# Patient Record
Sex: Female | Born: 1965 | Race: Black or African American | Hispanic: No | Marital: Married | Smoking: Never smoker
Health system: Southern US, Community
[De-identification: ages and names within clinical notes are randomized; demographics above are authoritative.]

## PROBLEM LIST (undated history)

## (undated) DIAGNOSIS — M549 Dorsalgia, unspecified: Secondary | ICD-10-CM

## (undated) DIAGNOSIS — M5136 Other intervertebral disc degeneration, lumbar region: Secondary | ICD-10-CM

## (undated) DIAGNOSIS — K5900 Constipation, unspecified: Secondary | ICD-10-CM

## (undated) DIAGNOSIS — K649 Unspecified hemorrhoids: Secondary | ICD-10-CM

## (undated) DIAGNOSIS — M51369 Other intervertebral disc degeneration, lumbar region without mention of lumbar back pain or lower extremity pain: Secondary | ICD-10-CM

## (undated) HISTORY — DX: Other intervertebral disc degeneration, lumbar region without mention of lumbar back pain or lower extremity pain: M51.369

## (undated) HISTORY — DX: Constipation, unspecified: K59.00

## (undated) HISTORY — DX: Unspecified hemorrhoids: K64.9

## (undated) HISTORY — DX: Other intervertebral disc degeneration, lumbar region: M51.36

## (undated) HISTORY — PX: ABDOMINAL HYSTERECTOMY: SHX81

## (undated) HISTORY — PX: BREAST BIOPSY: SHX20

---

## 2004-02-13 ENCOUNTER — Inpatient Hospital Stay (HOSPITAL_COMMUNITY): Admission: EM | Admit: 2004-02-13 | Discharge: 2004-02-18 | Payer: Self-pay | Admitting: Emergency Medicine

## 2004-02-15 ENCOUNTER — Encounter: Payer: Self-pay | Admitting: Internal Medicine

## 2005-03-27 IMAGING — CT CT ABDOMEN W/ CM
1 series · 16 of 32 positions shown, 20 images · IV contrast (GASTROGRAFIN)
Comparison: 02/13/2004.

CLINICAL DATA: Right lower quadrant pain.  
 CT ABDOMEN AND PELVIS WITH CONTRAST ? 02/15/2004 (2922 hours)

[Series 2: routine abdomen · axial · 0.70mm/px · z∈[-441,-46]mm · 16 of 114 slices shown, 20 images]
[im 8/114  soft-tissue]
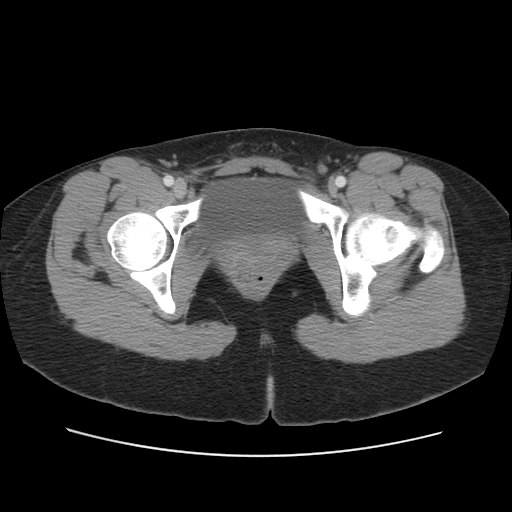
[im 8/114  bone]
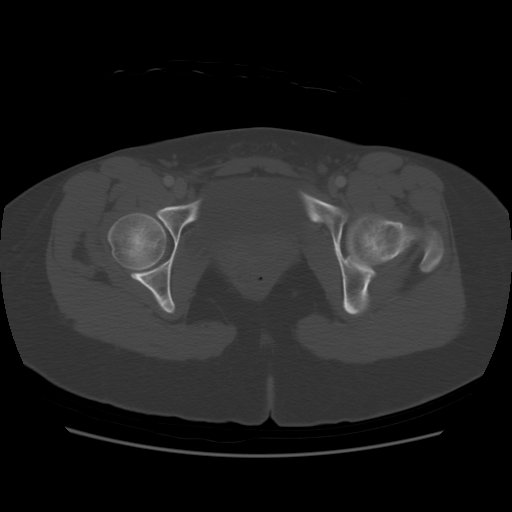
[im 15/114  soft-tissue]
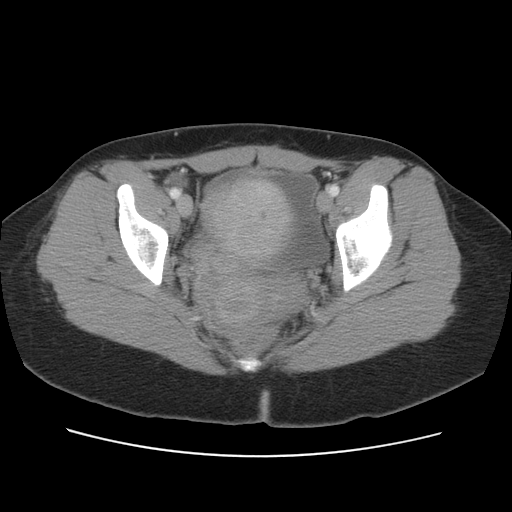
[im 22/114  soft-tissue]
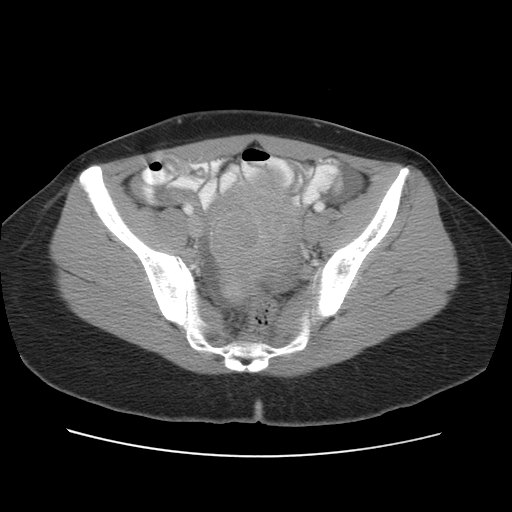
[im 30/114  soft-tissue]
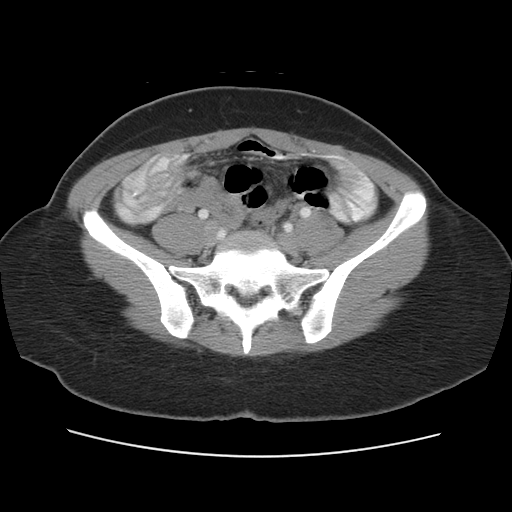
[im 37/114  soft-tissue]
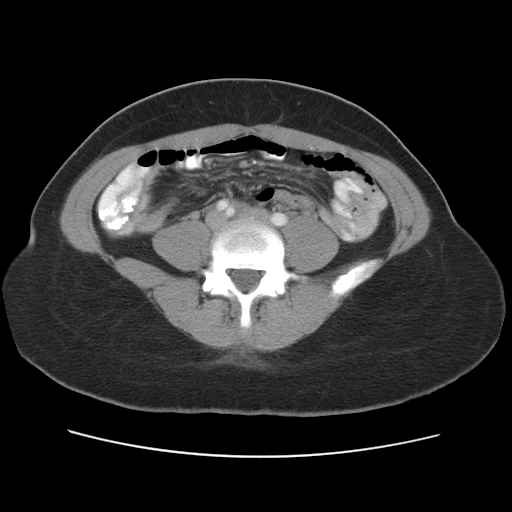
[im 44/114  soft-tissue]
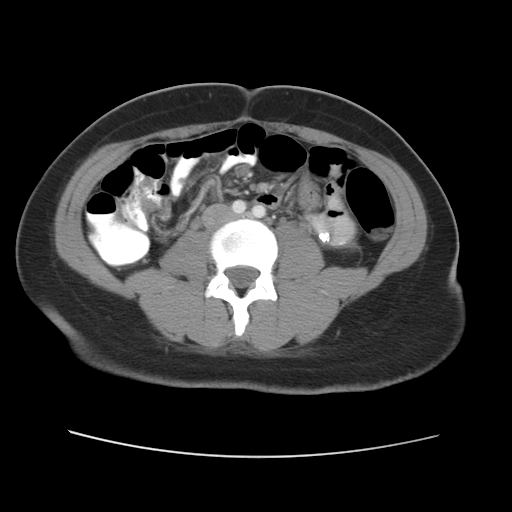
[im 52/114  soft-tissue]
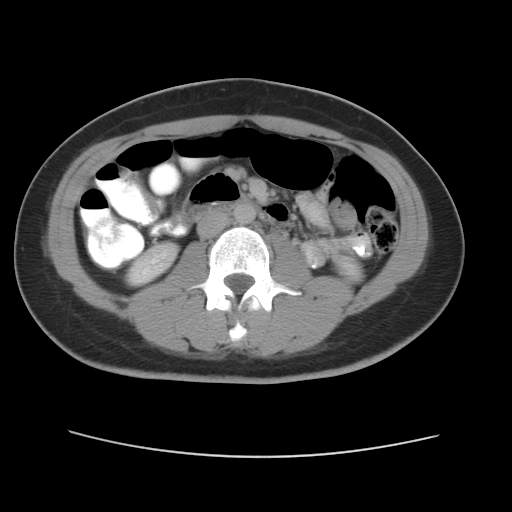
[im 62/114  soft-tissue]
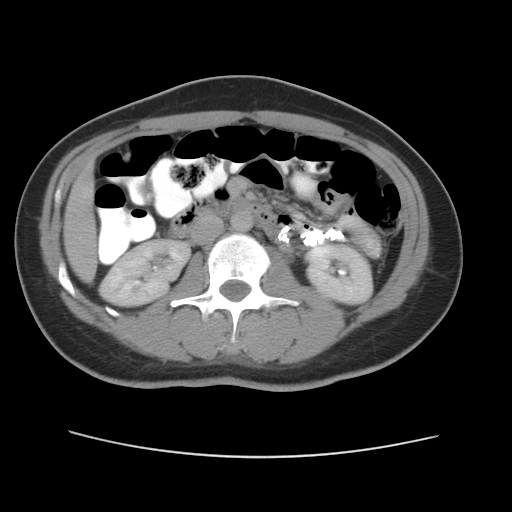
[im 70/114  soft-tissue]
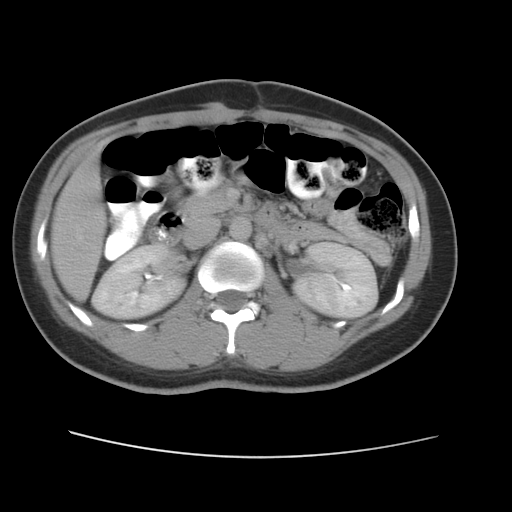
[im 70/114  bone]
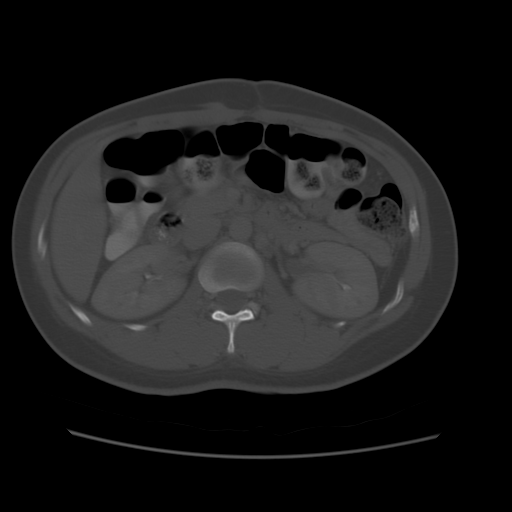
[im 77/114  soft-tissue]
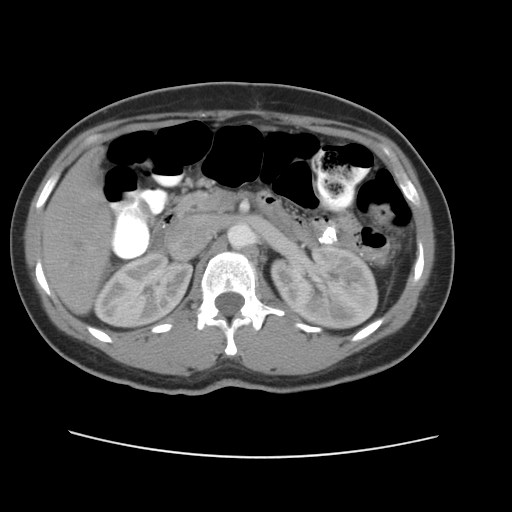
[im 84/114  soft-tissue]
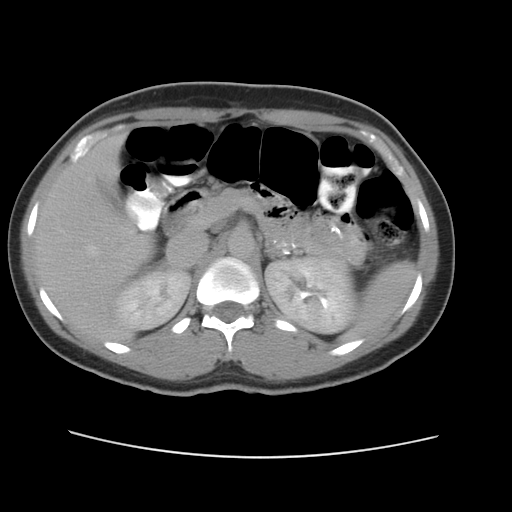
[im 92/114  soft-tissue]
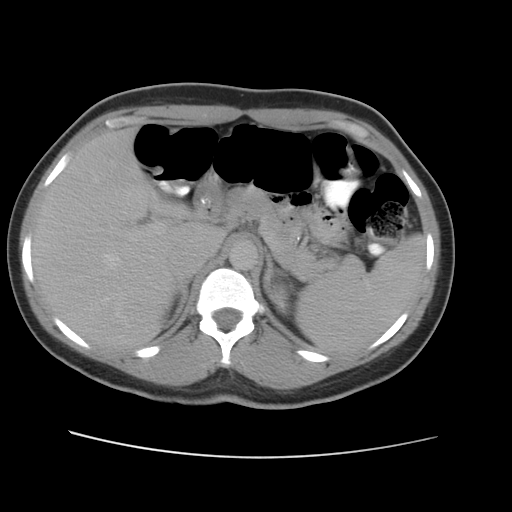
[im 99/114  soft-tissue]
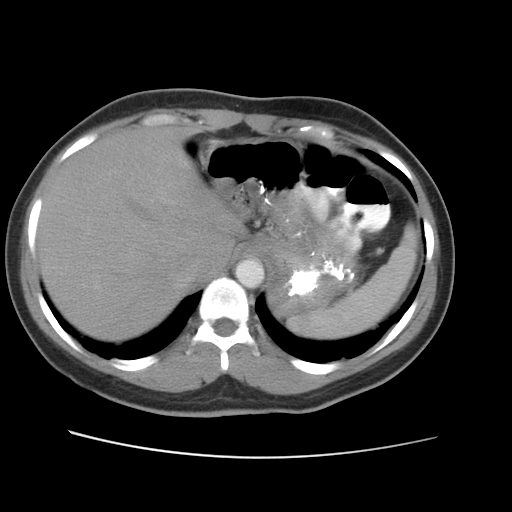
[im 99/114  lung]
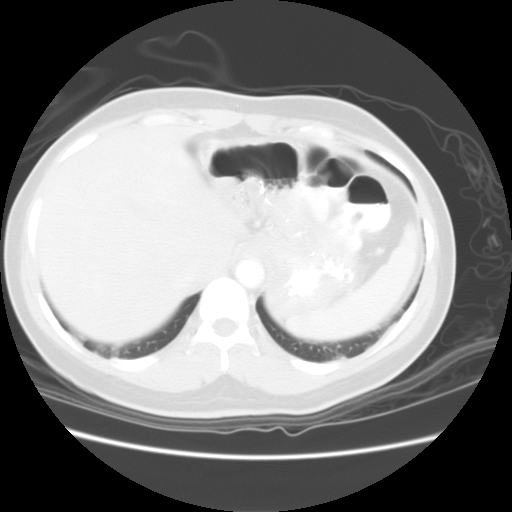
[im 103/114  lung]
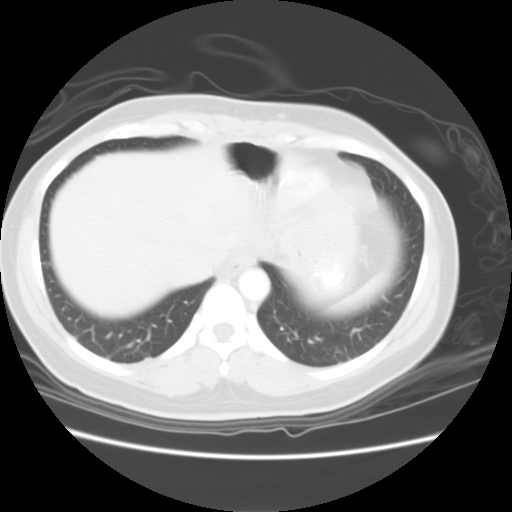
[im 106/114  soft-tissue]
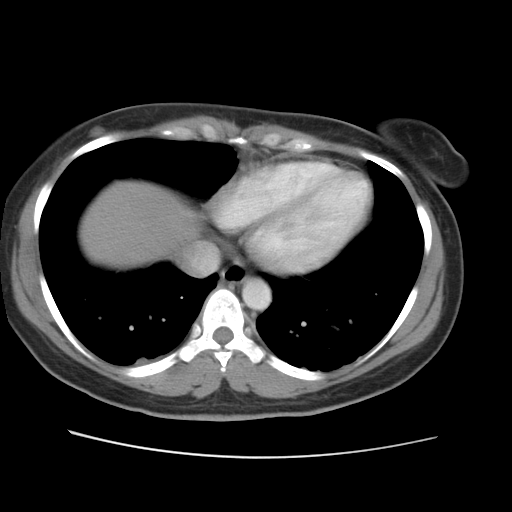
[im 106/114  lung]
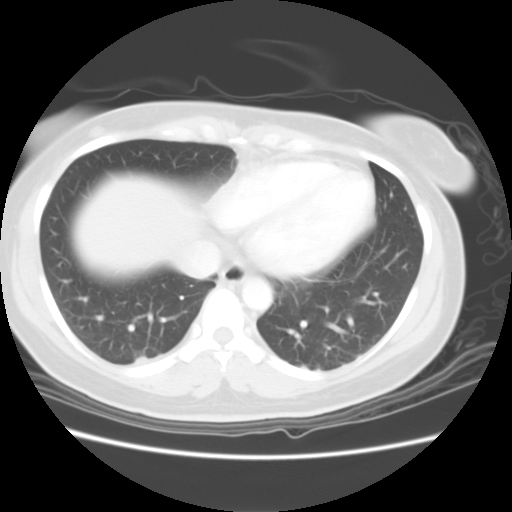
[im 110/114  lung]
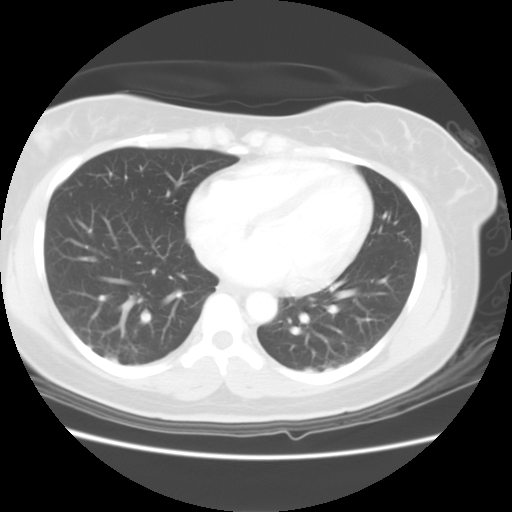

[16 of 32 positions shown; findings below may reference images not displayed]

CT ABDOMEN
 The liver, kidneys, adrenal glands, pancreas, and spleen are within normal limits.  Negative abnormal adenopathy.  A small amount of free fluid is seen in the left paracolic gutter.
 IMPRESSION
 Small amount of free fluid in the left paracolic gutter. 
 CT PELVIS
 The appendix is normal and is opacified with contrast on images 49 through 53.  The small bowel is decompressed.  A small amount of free fluid is seen in the pelvis.  Low density is seen centrally within the uterus, I suspect fibroids are present as the uterus is somewhat lobulated.  Negative abnormal adenopathy.  
 IMPRESSION
 Small amount of free fluid. 
 I suspect uterine fibroids. This can be further characterized with ultrasound.

## 2009-08-28 ENCOUNTER — Emergency Department (HOSPITAL_COMMUNITY): Admission: EM | Admit: 2009-08-28 | Discharge: 2009-08-28 | Payer: Self-pay | Admitting: Emergency Medicine

## 2011-02-18 NOTE — Discharge Summary (Signed)
Sally Kidd, Sally Kidd                      ACCOUNT NO.:  [id number]   MEDICAL RECORD NO.:  [id number]                   PATIENT TYPE:  INP   LOCATION:  3032                                 FACILITY:  [facility]   PHYSICIAN:  Melissa L. Ladona Ridgel, MD               DATE OF BIRTH:  1966-08-05   DATE OF ADMISSION:  02/13/2004  DATE OF DISCHARGE:  02/18/2004                                 DISCHARGE SUMMARY   PRESENTING DIAGNOSES:  1. Nausea and vomiting.  2. Constipation.  3. Possible urinary tract infection.  4. Chronic back and abdominal pain secondary to fibroid disease.   DISCHARGE DIAGNOSES:  1. Constipation, resolved.  2. Nausea and vomiting, resolved.  3. Three days of treatment for presumptive urinary tract infection,     completed Cipro x3 days.   MEDICATIONS AT THE TIME OF DISCHARGE:  1. Colace 100 mg b.i.d.  2. Dulcolax as needed.   HISTORY OF PRESENT ILLNESS:  The patient is a 45 year old African American  female with past medical history for fibroid disease, causing back and  abdominal pain that is chronic.  She developed 2 days of constipation,  relieved with prune juice, however, on the day of admission, she also  complaining of cramping abdominal pain with nausea and vomiting.  She was  found to be febrile with temperatures of 101.6 and was admitted to the  hospital for further evaluation of an elevated white count of 13.8 and  abdominal pain, nausea and vomiting.  Chest x-ray completed in the emergency  room showed no acute disease.  Her KUB showed increased stool in the left  colon with air-fluid level in the right colon but no free air.   HOSPITAL COURSE:  She was admitted to the general medical floor for  hydration and empiric treatment for a UTI as well as treatment of her  constipation.  On Feb 14, 2004, the patient remained with nausea and  vomiting; she was unable to keep down any food.  By Feb 15, 2004, she did  have some improvement and was administered  a  Fleet Enema with only small results.  She was offered a full meal for  dinner which she was unable to eat, despite feeling much, much improved.  CT  of the abdomen was relatively unremarkable except for showing stool in the  colon; it did also show a right ovarian cyst and small amount of free fluid.  A CA125 level was sent to evaluate the right ovarian cyst; this came back  within normal limits.  By Feb 17, 2004, the patient was hopeful about being  discharged to home.  She again was offered a full diet and discharge  planning was provided for her on the evening of Feb 17, 2004; however, the  patient states that she had small amounts of the food and developed again  abdominal cramping which was inconsistent with the onset of her period, she  therefore  declined discharge.  On Feb 18, 2004, the patient is able to  tolerate a full meal, she denies any nausea or vomiting, her constipation is  improved.  The patient will be discharged to follow up with her primary care  physician in [place] and her outpatient workup for some cysts.  She is  scheduled for  EGD next week, which should not be put off.  She will be  requested to follow up on her TSH, which was noted to be slightly abnormal;  the level was found to be low at 0.335.   LABORATORY VALUES:  Also, pertinent laboratory values revealed a negative  pregnancy test.  A wet prep showed a few white blood cells and moderate clue  cells.  She has a negative GC probe, a negative Chlamydia probe.  Her most  recent laboratory values reveal a lipase within normal limits, amylase  within normal limits, a hemoglobin of 10.6, hematocrit of 30.4 and a  complete metabolic panel which is revealing for slightly low potassium, but  LFTs that are within normal limits.   FOLLOWUP:  The patient will be discharged to follow up with her gynecologist  and her primary care physician regarding her low thyroid.                                                 Melissa L. Ladona Ridgel, MD    MLT/MEDQ  D:  02/18/2004  T:  02/19/2004  Job:  [id number]

## 2013-05-31 ENCOUNTER — Ambulatory Visit: Payer: Self-pay

## 2013-06-10 ENCOUNTER — Emergency Department (HOSPITAL_COMMUNITY)
Admission: EM | Admit: 2013-06-10 | Discharge: 2013-06-10 | Disposition: A | Discharge status: Home / Self Care | Payer: No Typology Code available for payment source | Attending: Emergency Medicine | Admitting: Emergency Medicine

## 2013-06-10 ENCOUNTER — Encounter (HOSPITAL_COMMUNITY): Payer: Self-pay | Admitting: Nurse Practitioner

## 2013-06-10 DIAGNOSIS — M5126 Other intervertebral disc displacement, lumbar region: Secondary | ICD-10-CM | POA: Insufficient documentation

## 2013-06-10 DIAGNOSIS — M545 Low back pain, unspecified: Secondary | ICD-10-CM | POA: Insufficient documentation

## 2013-06-10 DIAGNOSIS — G8929 Other chronic pain: Secondary | ICD-10-CM | POA: Insufficient documentation

## 2013-06-10 DIAGNOSIS — N390 Urinary tract infection, site not specified: Secondary | ICD-10-CM | POA: Insufficient documentation

## 2013-06-10 DIAGNOSIS — R209 Unspecified disturbances of skin sensation: Secondary | ICD-10-CM | POA: Insufficient documentation

## 2013-06-10 DIAGNOSIS — Z79899 Other long term (current) drug therapy: Secondary | ICD-10-CM | POA: Insufficient documentation

## 2013-06-10 DIAGNOSIS — M549 Dorsalgia, unspecified: Secondary | ICD-10-CM

## 2013-06-10 DIAGNOSIS — Z3202 Encounter for pregnancy test, result negative: Secondary | ICD-10-CM | POA: Insufficient documentation

## 2013-06-10 HISTORY — DX: Dorsalgia, unspecified: M54.9

## 2013-06-10 LAB — URINALYSIS, ROUTINE W REFLEX MICROSCOPIC
Glucose, UA: NEGATIVE mg/dL
Ketones, ur: NEGATIVE mg/dL
Nitrite: NEGATIVE
Specific Gravity, Urine: <1.005 — ABNORMAL LOW (ref 1.005–1.030)
pH: 7 (ref 5.0–8.0)

## 2013-06-10 LAB — PREGNANCY, URINE: Preg Test, Ur: NEGATIVE

## 2013-06-10 LAB — URINE MICROSCOPIC-ADD ON

## 2013-06-10 MED ORDER — TRAMADOL HCL 50 MG PO TABS: 50.0000 mg | ORAL_TABLET | Freq: Four times a day (QID) | ORAL | Status: DC | PRN

## 2013-06-10 MED ORDER — KETOROLAC TROMETHAMINE 30 MG/ML IJ SOLN
30.0000 mg | Freq: Once | INTRAMUSCULAR | Status: AC
  Administered 2013-06-10: 30 mg via INTRAMUSCULAR
  Filled 2013-06-10: qty 1

## 2013-06-10 MED ORDER — CEPHALEXIN 500 MG PO CAPS: 500.0000 mg | ORAL_CAPSULE | Freq: Two times a day (BID) | ORAL | Status: DC

## 2013-06-10 MED ORDER — KETOROLAC TROMETHAMINE 30 MG/ML IJ SOLN: 30.0000 mg | Freq: Once | INTRAMUSCULAR | Status: DC

## 2013-06-10 NOTE — ED Notes (Signed)
Pt fell and injured her back last year, was being treated by an orthopedic doctor in [place] and just moved here. Pt is now on a wait list for an orthopedic doctor here and has run out of medications. Requesting a refill on tramadol.

## 2013-06-10 NOTE — ED Provider Notes (Signed)
CSN: [id number]     Arrival date & time 06/10/13  0865 History   First MD Initiated Contact with Patient 06/10/13 [phone]     Chief Complaint  Patient presents with  . Back Pain    HPI  Sally Kidd is a 47 y.o. female with a PMH of chronic back pain who presents to the ED for evaluation of back pain.  History was provided by the patient.  Patient states that she's been having chronic lower back pain for the past year. She states that she had injury where she slipped and fell and sustained an injury to her lower back about a year ago. She states she has a herniated disc and has been declining surgery.  No new injuries or trauma.  She states that she was seeing an orthopedic physician in [place] and was receiving injections in her back which was relieving her pain. She states that she recently moved back to [place] 4 months ago to take care of her mother and she has not been able to establish care with a new orthopedic provider yet. She states that her pain is located in her lower back and is described as an aching sensation.  Her pain is similar to the back pain she has experienced in the past. She states that tramadol helps relieve her pain however she ran out. Movement makes her pain worse. She intermittently gets numbness and tingling in her legs however denies this currently.She states she was recently treated for urinary tract infection about a week ago with 3 days of Cipro but states "I don't think I am any better."  She denies any dysuria, hematuria, vaginal discharge currently.  She denies any loss of sensation, loss of bowel/bladder function, weakness, fever, chills, change in activity or appetite. She otherwise been well with no recent rhinorrhea, congestion, chest pain, shortness of breath, abdominal pain, nausea, vomiting, or leg edema.    Past Medical History  Diagnosis Date  . Back pain    Past Surgical History  Procedure Laterality Date  . Abdominal hysterectomy      History reviewed. No pertinent family history. History  Substance Use Topics  . Smoking status: Never Smoker   . Smokeless tobacco: Not on file  . Alcohol Use: No   OB History   Grav Para Term Preterm Abortions TAB SAB Ect Mult Living                 Review of Systems  Constitutional: Negative for fever, chills, activity change, appetite change and fatigue.  HENT: Negative for congestion, sore throat, rhinorrhea, neck pain and neck stiffness.   Eyes: Negative for visual disturbance.  Respiratory: Negative for cough and shortness of breath.   Cardiovascular: Negative for chest pain and leg swelling.  Gastrointestinal: Negative for nausea, vomiting, abdominal pain, diarrhea and constipation.  Genitourinary: Negative for dysuria, decreased urine volume, vaginal bleeding, vaginal discharge and vaginal pain.  Musculoskeletal: Positive for back pain. Negative for myalgias, joint swelling, arthralgias and gait problem.  Skin: Negative for wound.  Neurological: Positive for numbness (intermittent). Negative for dizziness, syncope, weakness, light-headedness and headaches.  Psychiatric/Behavioral: Negative for confusion.    Allergies  Review of patient's allergies indicates no known allergies.  Home Medications   Current Outpatient Rx  Name  Route  Sig  Dispense  Refill  . cetirizine (ZYRTEC) 10 MG tablet   Oral   Take 10 mg by mouth daily.         . diclofenac (VOLTAREN)  75 MG EC tablet   Oral   Take 75 mg by mouth 2 (two) times daily.         . methocarbamol (ROBAXIN) 500 MG tablet   Oral   Take 1,000 mg by mouth 4 (four) times daily.         . traMADol (ULTRAM) 50 MG tablet   Oral   Take 50-100 mg by mouth every 8 (eight) hours as needed for pain.          BP 126/79  Pulse 89  Temp(Src) 98.4 F (36.9 C) (Oral)  Resp 18  SpO2 98%  Filed Vitals:   06/10/13 0929  BP: 126/79  Pulse: 89  Temp: 98.4 F (36.9 C)  TempSrc: Oral  Resp: 18  SpO2: 98%      Physical Exam  Nursing note and vitals reviewed. Constitutional: She is oriented to person, place, and time. She appears well-developed and well-nourished. No distress.  HENT:  Head: Normocephalic and atraumatic.  Right Ear: External ear normal.  Left Ear: External ear normal.  Nose: Nose normal.  Mouth/Throat: Oropharynx is clear and moist. No oropharyngeal exudate.  Eyes: Conjunctivae are normal. Pupils are equal, round, and reactive to light. Right eye exhibits no discharge. Left eye exhibits no discharge.  Neck: Normal range of motion. Neck supple.  Cardiovascular: Normal rate, regular rhythm, normal heart sounds and intact distal pulses.  Exam reveals no gallop and no friction rub.   No murmur heard. Dorsalis pedis pulses present bilaterally  Pulmonary/Chest: Effort normal and breath sounds normal. No respiratory distress. She has no wheezes. She has no rales. She exhibits no tenderness.  Abdominal: Soft. Bowel sounds are normal. She exhibits no distension. There is no tenderness. There is no rebound and no guarding.  Musculoskeletal: Normal range of motion. She exhibits tenderness. She exhibits no edema.  Diffuse lumbar paraspinal tenderness to palpation with no focal lumbar spinal tenderness.  Patient able to ambulate without difficulty or ataxia.   Neurological: She is alert and oriented to person, place, and time. She has normal reflexes.  Gross sensation intact in the lower extremities bilaterally.  Patellar reflexes intact.    Skin: Skin is warm and dry. She is not diaphoretic.  No ecchymosis, edema, erythema, or wounds to the back diffusely    ED Course  Procedures (including critical care time) Labs Review Labs Reviewed  URINALYSIS, ROUTINE W REFLEX MICROSCOPIC  PREGNANCY, URINE   Imaging Review No results found.  Results for orders placed during the hospital encounter of 06/10/13  URINALYSIS, ROUTINE W REFLEX MICROSCOPIC      Result Value Range   Color,  Urine YELLOW  YELLOW   APPearance CLEAR  CLEAR   Specific Gravity, Urine <1.005 (*) 1.005 - 1.030   pH 7.0  5.0 - 8.0   Glucose, UA NEGATIVE  NEGATIVE mg/dL   Hgb urine dipstick TRACE (*) NEGATIVE   Bilirubin Urine NEGATIVE  NEGATIVE   Ketones, ur NEGATIVE  NEGATIVE mg/dL   Protein, ur NEGATIVE  NEGATIVE mg/dL   Urobilinogen, UA 0.2  0.0 - 1.0 mg/dL   Nitrite NEGATIVE  NEGATIVE   Leukocytes, UA LARGE (*) NEGATIVE  PREGNANCY, URINE      Result Value Range   Preg Test, Ur NEGATIVE  NEGATIVE  URINE MICROSCOPIC-ADD ON      Result Value Range   Squamous Epithelial / LPF FEW (*) RARE   WBC, UA 7-10  <3 WBC/hpf   RBC / HPF 0-2  <3 RBC/hpf  Bacteria, UA FEW (*) RARE    MDM  No diagnosis found.  Sally Kidd is a 47 y.o. female with a PMH of chronic back pain who presents to the ED for evaluation of back pain. UA and urine pregnancy was ordered to further evaluate.  Toradol was ordered for symptomatic relief.     Rechecks  1:30 PM = Patient resting comfortably in the visitor's chair.  Playing on phone.       Etiology of back pain is likely chronic in nature.  Patient states that her back pain is similar to her chronic back pain with no acute changes.  She was neurovascularly intact.  She had no neurological deficits, was able to ambulate without difficulty, afebrile, and remained in no acute distress throughout her ED visit.  She was prescribed tramadol for outpatient management, which she states is the only thing that improves her pain.  Patient was given referral to orthopedic and neurosurgery, since she has seen both in the past.  Patient will also be tx for a possible continued UTI with Keflex.  Urine sent for culture.  Patient was instructed to return to the ED if they experience any numbness, tingling, weakness, loss of sensation, worsening symptoms, loss of bowel/bladder function, fever, abdominal pain, or other concerns.  Patient was in agreement with discharge and plan.      Final impressions: 1. Back pain, lower, chronic  2. UTI     Luiz Iron PA-C          Jillyn Ledger, PA-C 06/11/13 [phone]

## 2013-06-10 NOTE — ED Notes (Signed)
Pt has hx of a fall and was tx'd in [place]. Told she had a "herniated disc". Has now moved here and is trying to get established with local MD's. C/o pain from neck to lower back. States pain starts as "burning pain" . Also states she has intermittent tingling to LLE.

## 2013-06-12 LAB — URINE CULTURE

## 2013-06-12 NOTE — ED Provider Notes (Signed)
Medical screening examination/treatment/procedure(s) were performed by non-physician practitioner and as supervising physician I was immediately available for consultation/collaboration.   Darlys Gales, MD 06/12/13 [phone]

## 2013-06-18 ENCOUNTER — Encounter (HOSPITAL_COMMUNITY): Payer: Self-pay | Admitting: Emergency Medicine

## 2013-06-18 ENCOUNTER — Emergency Department (HOSPITAL_COMMUNITY)
Admission: EM | Admit: 2013-06-18 | Discharge: 2013-06-18 | Disposition: A | Discharge status: Home / Self Care | Payer: No Typology Code available for payment source | Attending: Emergency Medicine | Admitting: Emergency Medicine

## 2013-06-18 DIAGNOSIS — G8929 Other chronic pain: Secondary | ICD-10-CM | POA: Insufficient documentation

## 2013-06-18 DIAGNOSIS — M549 Dorsalgia, unspecified: Secondary | ICD-10-CM | POA: Insufficient documentation

## 2013-06-18 DIAGNOSIS — Z79899 Other long term (current) drug therapy: Secondary | ICD-10-CM | POA: Insufficient documentation

## 2013-06-18 DIAGNOSIS — Z791 Long term (current) use of non-steroidal anti-inflammatories (NSAID): Secondary | ICD-10-CM | POA: Insufficient documentation

## 2013-06-18 DIAGNOSIS — Z792 Long term (current) use of antibiotics: Secondary | ICD-10-CM | POA: Insufficient documentation

## 2013-06-18 MED ORDER — KETOROLAC TROMETHAMINE 60 MG/2ML IM SOLN
60.0000 mg | Freq: Once | INTRAMUSCULAR | Status: AC
  Administered 2013-06-18: 60 mg via INTRAMUSCULAR
  Filled 2013-06-18: qty 2

## 2013-06-18 MED ORDER — TRAMADOL HCL 50 MG PO TABS: 50.0000 mg | ORAL_TABLET | Freq: Four times a day (QID) | ORAL | Status: DC | PRN

## 2013-06-18 NOTE — ED Notes (Signed)
Patient states that she had been followed by a doctor in [place] for bulging disc and chronic back problems.   Patient states that she did go to the ortho office, but is still waiting to be seen.   Patient states she was seen 2 weeks ago for the same thing.   Patient states she needs tramadol prescription.

## 2013-06-18 NOTE — ED Provider Notes (Signed)
CSN: [id number]     Arrival date & time 06/18/13  0906 History   First MD Initiated Contact with Patient 06/18/13 0919     Chief Complaint  Patient presents with  . Back Pain   (Consider location/radiation/quality/duration/timing/severity/associated sxs/prior Treatment) HPI Comments: Patient's 47 year old female who presents today for medication refill. She has had chronic back pain as a result of herniated discs since March 10, 2012 when she fell onto a marble floor. She recently moved from [place] and had her prescriptions transferred to a [facility]. She went to the pharmacy to pick up her tramadol, but the pharmacist said that this is a controlled substance and she needed a new prescription. No change in her back pain. Her back pain feels like "someone is taking a jackhammer to my back". It radiates up and down her back, but not into her legs. She does not use a walker or a cane. Tramadol is the only thing that makes her pain better. No bowel or bladder incontinence, history of cancer, drug abuse. Fevers, chills, nausea, vomiting, abdominal pain, numbness, weakness, paresthesias.  Patient is a 47 y.o. female presenting with back pain. The history is provided by the patient. No language interpreter was used.  Back Pain Associated symptoms: no abdominal pain, no chest pain, no fever, no numbness and no weakness     Past Medical History  Diagnosis Date  . Back pain    Past Surgical History  Procedure Laterality Date  . Abdominal hysterectomy     No family history on file. History  Substance Use Topics  . Smoking status: Never Smoker   . Smokeless tobacco: Not on file  . Alcohol Use: No   OB History   Grav Para Term Preterm Abortions TAB SAB Ect Mult Living                 Review of Systems  Constitutional: Negative for fever and chills.  Respiratory: Negative for shortness of breath.   Cardiovascular: Negative for chest pain.  Gastrointestinal: Negative for nausea, vomiting and  abdominal pain.  Musculoskeletal: Positive for myalgias, back pain and arthralgias. Negative for gait problem.  Neurological: Negative for weakness and numbness.  All other systems reviewed and are negative.    Allergies  Review of patient's allergies indicates no known allergies.  Home Medications   Current Outpatient Rx  Name  Route  Sig  Dispense  Refill  . cephALEXin (KEFLEX) 500 MG capsule   Oral   Take 1 capsule (500 mg total) by mouth 2 (two) times daily.   14 capsule   0   . cetirizine (ZYRTEC) 10 MG tablet   Oral   Take 10 mg by mouth daily.         . diclofenac (VOLTAREN) 75 MG EC tablet   Oral   Take 75 mg by mouth 2 (two) times daily.         . methocarbamol (ROBAXIN) 500 MG tablet   Oral   Take 1,000 mg by mouth 4 (four) times daily.         . traMADol (ULTRAM) 50 MG tablet   Oral   Take 50-100 mg by mouth every 8 (eight) hours as needed for pain.         . traMADol (ULTRAM) 50 MG tablet   Oral   Take 1 tablet (50 mg total) by mouth every 6 (six) hours as needed for pain.   15 tablet   0    BP 134/81  Pulse 93  Temp(Src) 97.2 F (36.2 C) (Oral)  Resp 18  Ht 5\' 7"  (1.702 m)  Wt 147 lb (66.679 kg)  BMI 23.02 kg/m2  SpO2 98% Physical Exam  Nursing note and vitals reviewed. Constitutional: She is oriented to person, place, and time. She appears well-developed and well-nourished.  Non-toxic appearance. She does not have a sickly appearance. She does not appear ill. No distress.  Patient ambulated to room ease.  HENT:  Head: Normocephalic and atraumatic.  Right Ear: External ear normal.  Left Ear: External ear normal.  Nose: Nose normal.  Mouth/Throat: Oropharynx is clear and moist.  Eyes: Conjunctivae are normal.  Neck: Normal range of motion.  Cardiovascular: Normal rate, regular rhythm, normal heart sounds, intact distal pulses and normal pulses.   Pulmonary/Chest: Effort normal and breath sounds normal. No stridor. No respiratory  distress. She has no wheezes. She has no rales.  Abdominal: Soft. She exhibits no distension.  Musculoskeletal: Normal range of motion.  Tenderness to palpation along spine, no deformity or step off.   Neurological: She is alert and oriented to person, place, and time. She has normal strength. No sensory deficit. Gait normal.  Neurovascularly intact  Skin: Skin is warm and dry. She is not diaphoretic. No erythema.  Psychiatric: She has a normal mood and affect. Her behavior is normal.    ED Course  Procedures (including critical care time) Labs Review Labs Reviewed - No data to display Imaging Review No results found.  MDM   1. Back pain, chronic    Patient with back pain.  No neurological deficits and normal neuro exam.  Patient can walk but states is painful.  No loss of bowel or bladder control.  No concern for cauda equina.  No fever, night sweats, weight loss, h/o cancer, IVDU.  RICE protocol and pain medicine indicated and discussed with patient. Gave Toradol in ED. Did give 15 Tramadol for home. She has started the process of establishing care with both ortho and PCP. She has a PCP appointment on Sept 15.      Mora Bellman, PA-C 06/18/13 [phone]

## 2013-06-19 NOTE — ED Provider Notes (Signed)
Medical screening examination/treatment/procedure(s) were performed by non-physician practitioner and as supervising physician I was immediately available for consultation/collaboration.   Joya Gaskins, MD 06/19/13 [phone]

## 2013-06-24 ENCOUNTER — Encounter (HOSPITAL_COMMUNITY): Payer: Self-pay | Admitting: Emergency Medicine

## 2013-06-24 ENCOUNTER — Emergency Department (HOSPITAL_COMMUNITY)
Admission: EM | Admit: 2013-06-24 | Discharge: 2013-06-24 | Disposition: A | Discharge status: Home / Self Care | Payer: No Typology Code available for payment source | Attending: Emergency Medicine | Admitting: Emergency Medicine

## 2013-06-24 DIAGNOSIS — G8929 Other chronic pain: Secondary | ICD-10-CM

## 2013-06-24 DIAGNOSIS — Z79899 Other long term (current) drug therapy: Secondary | ICD-10-CM | POA: Insufficient documentation

## 2013-06-24 DIAGNOSIS — M549 Dorsalgia, unspecified: Secondary | ICD-10-CM | POA: Insufficient documentation

## 2013-06-24 DIAGNOSIS — Z3202 Encounter for pregnancy test, result negative: Secondary | ICD-10-CM | POA: Insufficient documentation

## 2013-06-24 LAB — URINALYSIS, ROUTINE W REFLEX MICROSCOPIC
Nitrite: NEGATIVE
Specific Gravity, Urine: 1.009 (ref 1.005–1.030)
Urobilinogen, UA: 0.2 mg/dL (ref 0.0–1.0)
pH: 7 (ref 5.0–8.0)

## 2013-06-24 LAB — URINE MICROSCOPIC-ADD ON

## 2013-06-24 MED ORDER — TRAMADOL HCL 50 MG PO TABS: 50.0000 mg | ORAL_TABLET | Freq: Three times a day (TID) | ORAL | Status: DC | PRN

## 2013-06-24 MED ORDER — KETOROLAC TROMETHAMINE 30 MG/ML IJ SOLN
30.0000 mg | Freq: Once | INTRAMUSCULAR | Status: AC
  Administered 2013-06-24: 30 mg via INTRAMUSCULAR
  Filled 2013-06-24: qty 1

## 2013-06-24 NOTE — ED Notes (Signed)
Pt. requesting prescription ( Tramadol 50 mg) for her chronic back pain .

## 2013-06-24 NOTE — Discharge Instructions (Signed)
Back Pain, Adult  Low back pain is very common. About 1 in 5 people have back pain. The cause of low back pain is rarely dangerous. The pain often gets better over time. About half of people with a sudden onset of back pain feel better in just 2 weeks. About 8 in 10 people feel better by 6 weeks.   CAUSES  Some common causes of back pain include:  · Strain of the muscles or ligaments supporting the spine.  · Wear and tear (degeneration) of the spinal discs.  · Arthritis.  · Direct injury to the back.  DIAGNOSIS  Most of the time, the direct cause of low back pain is not known. However, back pain can be treated effectively even when the exact cause of the pain is unknown. Answering your caregiver's questions about your overall health and symptoms is one of the most accurate ways to make sure the cause of your pain is not dangerous. If your caregiver needs more information, he or she may order lab work or imaging tests (X-rays or MRIs). However, even if imaging tests show changes in your back, this usually does not require surgery.  HOME CARE INSTRUCTIONS  For many people, back pain returns. Since low back pain is rarely dangerous, it is often a condition that people can learn to manage on their own.   · Remain active. It is stressful on the back to sit or stand in one place. Do not sit, drive, or stand in one place for more than 30 minutes at a time. Take short walks on level surfaces as soon as pain allows. Try to increase the length of time you walk each day.  · Do not stay in bed. Resting more than 1 or 2 days can delay your recovery.  · Do not avoid exercise or work. Your body is made to move. It is not dangerous to be active, even though your back may hurt. Your back will likely heal faster if you return to being active before your pain is gone.  · Pay attention to your body when you  bend and lift. Many people have less discomfort when lifting if they bend their knees, keep the load close to their bodies, and  avoid twisting. Often, the most comfortable positions are those that put less stress on your recovering back.  · Find a comfortable position to sleep. Use a firm mattress and lie on your side with your knees slightly bent. If you lie on your back, put a pillow under your knees.  · Only take over-the-counter or prescription medicines as directed by your caregiver. Over-the-counter medicines to reduce pain and inflammation are often the most helpful. Your caregiver may prescribe muscle relaxant drugs. These medicines help dull your pain so you can more quickly return to your normal activities and healthy exercise.  · Put ice on the injured area.  · Put ice in a plastic bag.  · Place a towel between your skin and the bag.  · Leave the ice on for 15-20 minutes, 3-4 times a day for the first 2 to 3 days. After that, ice and heat may be alternated to reduce pain and spasms.  · Ask your caregiver about trying back exercises and gentle massage. This may be of some benefit.  · Avoid feeling anxious or stressed. Stress increases muscle tension and can worsen back pain. It is important to recognize when you are anxious or stressed and learn ways to manage it. Exercise is a great option.  SEEK MEDICAL CARE IF:  · You have pain that is not relieved with rest or   medicine.  · You have pain that does not improve in 1 week.  · You have new symptoms.  · You are generally not feeling well.  SEEK IMMEDIATE MEDICAL CARE IF:   · You have pain that radiates from your back into your legs.  · You develop new bowel or bladder control problems.  · You have unusual weakness or numbness in your arms or legs.  · You develop nausea or vomiting.  · You develop abdominal pain.  · You feel faint.  Document Released: 09/19/2005 Document Revised: 03/20/2012 Document Reviewed: 02/07/2011  [employer]® Patient Information ©2014 [employer].

## 2013-06-24 NOTE — ED Provider Notes (Signed)
CSN: [id number]     Arrival date & time 06/24/13  1520 History  This chart was scribed for non-physician practitioner, Coral Ceo, PA-C working with Candyce Churn, MD by Greggory Stallion, ED scribe. This patient was seen in room TR06C/TR06C and the patient's care was started at 4:01 PM.   Chief Complaint  Patient presents with  . Medication Refill   The history is provided by the patient. No language interpreter was used.    HPI Comments: Sally Kidd is a 47 y.o. female who presents to the Emergency Department for medication refill. She states she has chronic back pain and has run out of her Tramadol 50 mg. Patient had a fall a year ago which resulted in a herniated disc.  Pain is located in the lower back without radiation or acute changes.  She recently moved here from [place] and is trying to establish care here in [place] for further evaluation and management.  She denies any new injury. Pt states she had an appointment with [facility] on 06/17/13 but was not able to be seen because they never called her. She has another appointment on 07/02/13. Pt has tried Tylenol and ibuprofen with no relief.  Tramadol is the only thing that relieves her pain.  Pt denies numbness, tingling, weakness, bowel or bladder incontinence, fever, chills, CP, SOB, abdominal pain, nausea and emesis.  Patient was treated for a recurrent UTI and has not had a follow-up urine to confirm clearance.  No dysuria, vaginal discharge, or vaginal bleeding.     Past Medical History  Diagnosis Date  . Back pain    Past Surgical History  Procedure Laterality Date  . Abdominal hysterectomy     No family history on file. History  Substance Use Topics  . Smoking status: Never Smoker   . Smokeless tobacco: Not on file  . Alcohol Use: No   OB History   Grav Para Term Preterm Abortions TAB SAB Ect Mult Living                 Review of Systems  Constitutional: Negative for fever, chills,  activity change, appetite change and fatigue.  HENT: Negative for ear pain, congestion, sore throat, rhinorrhea, neck pain and neck stiffness.   Eyes: Negative for visual disturbance.  Respiratory: Negative for cough and shortness of breath.   Cardiovascular: Negative for chest pain and leg swelling.  Gastrointestinal: Negative for nausea, vomiting, abdominal pain, diarrhea and constipation.  Genitourinary: Negative for dysuria, hematuria and difficulty urinating.       Denies bowel or bladder incontinence.   Musculoskeletal: Positive for back pain. Negative for myalgias, joint swelling, arthralgias and gait problem.  Skin: Negative for rash and wound.  Neurological: Negative for dizziness, weakness, light-headedness, numbness and headaches.  All other systems reviewed and are negative.    Allergies  Review of patient's allergies indicates no known allergies.  Home Medications   Current Outpatient Rx  Name  Route  Sig  Dispense  Refill  . cetirizine (ZYRTEC) 10 MG tablet   Oral   Take 20 mg by mouth daily.          . methocarbamol (ROBAXIN) 500 MG tablet   Oral   Take 1,000 mg by mouth 4 (four) times daily.         . Misc Natural Products (COLON CLEANSER PO)   Oral   Take 3 capsules by mouth once a week.         . traMADol (  ULTRAM) 50 MG tablet   Oral   Take 50-100 mg by mouth every 8 (eight) hours as needed for pain.          BP 97/71  Pulse 86  Temp(Src) 98.8 F (37.1 C) (Oral)  Resp 18  Ht 5\' 7"  (1.702 m)  Wt 145 lb (65.772 kg)  BMI 22.71 kg/m2  SpO2 97%  Filed Vitals:   06/24/13 1528 06/24/13 1741  BP: 97/71 107/75  Pulse: 86 72  Temp: 98.8 F (37.1 C) 97.5 F (36.4 C)  TempSrc: Oral Oral  Resp: 18 16  Height: 5\' 7"  (1.702 m)   Weight: 145 lb (65.772 kg)   SpO2: 97% 100%    Physical Exam  Nursing note and vitals reviewed. Constitutional: She is oriented to person, place, and time. She appears well-developed and well-nourished. No distress.   HENT:  Head: Normocephalic and atraumatic.  Right Ear: External ear normal.  Left Ear: External ear normal.  Nose: Nose normal.  Eyes: Conjunctivae and EOM are normal. Pupils are equal, round, and reactive to light.  Neck: Normal range of motion. Neck supple. No tracheal deviation present.  Cardiovascular: Normal rate, regular rhythm, normal heart sounds and intact distal pulses.  Exam reveals no gallop and no friction rub.   No murmur heard. Dorsalis pedis pulses present bilaterally  Pulmonary/Chest: Effort normal and breath sounds normal. No respiratory distress. She has no wheezes. She has no rales. She exhibits no tenderness.  Abdominal: Soft. Bowel sounds are normal. She exhibits no distension. There is no tenderness.  Musculoskeletal: Normal range of motion. She exhibits tenderness. She exhibits no edema.  Diffuse tenderness to palpation to the lumbar paraspinal muscles with no lumbar spinal tenderness.  Patient able to ambulate without difficulty or ataxia  Neurological: She is alert and oriented to person, place, and time. She has normal reflexes.  Gross sensation intact in the lower extremities  Skin: Skin is warm and dry. She is not diaphoretic.  No ecchymosis, edema, erythema, or lesions to the back  Psychiatric: She has a normal mood and affect. Her behavior is normal.    ED Course  Procedures (including critical care time)  DIAGNOSTIC STUDIES: Oxygen Saturation is 97% on RA, normal by my interpretation.    COORDINATION OF CARE: 4:10 PM-Discussed treatment plan which includes short course Tramadol refill with pt at bedside and pt agreed to plan. Advised pt to keep her appointment with orthopedics on 07/02/14.   Labs Review Labs Reviewed - No data to display Imaging Review No results found.  Results for orders placed during the hospital encounter of 06/24/13  URINALYSIS, ROUTINE W REFLEX MICROSCOPIC      Result Value Range   Color, Urine YELLOW  YELLOW   APPearance  HAZY (*) CLEAR   Specific Gravity, Urine 1.009  1.005 - 1.030   pH 7.0  5.0 - 8.0   Glucose, UA NEGATIVE  NEGATIVE mg/dL   Hgb urine dipstick NEGATIVE  NEGATIVE   Bilirubin Urine NEGATIVE  NEGATIVE   Ketones, ur NEGATIVE  NEGATIVE mg/dL   Protein, ur NEGATIVE  NEGATIVE mg/dL   Urobilinogen, UA 0.2  0.0 - 1.0 mg/dL   Nitrite NEGATIVE  NEGATIVE   Leukocytes, UA LARGE (*) NEGATIVE  PREGNANCY, URINE      Result Value Range   Preg Test, Ur NEGATIVE  NEGATIVE  URINE MICROSCOPIC-ADD ON      Result Value Range   Squamous Epithelial / LPF MANY (*) RARE   WBC, UA 7-10  <  3 WBC/hpf   Bacteria, UA FEW (*) RARE    MDM   1. Back pain, chronic     Mina Babula is a 47 y.o. female who presents to the Emergency Department for medication refill. UA and urine pregnancy ordered.  Toradol ordered for pain.     Rechecks  5:30 PM = Patient resting comfortably reading a book    Etiology of back pain is chronic in nature with no acute changes.  Patient was neurovascularly intact, afebrile, and remained in no acute distress throughout her ED visit.  Patient was prescribed a few tramadol for outpatient management.  Patient educated on the use of the ED for pain management.  Patient has an appointment with orthopedics for further evaluation and management on 07/02/13.  Urine sent for culture.  Patient was not treated for a UTI at this time as she is asymptomatic and her previous urine culture was negative.  Patient was instructed to return to the ED if they experience any loss of bowel/bladder, fever, weakness, or other concerns.  Patient was in agreement with discharge and plan.     Final impressions: 1. Back pain, chronic      Thomasenia Sales    I personally performed the services described in this documentation, which was scribed in my presence. The recorded information has been reviewed and is accurate.   Jillyn Ledger, PA-C 06/25/13 1339

## 2013-06-27 LAB — URINE CULTURE: Colony Count: 50000

## 2013-06-27 NOTE — ED Provider Notes (Signed)
Medical screening examination/treatment/procedure(s) were performed by non-physician practitioner and as supervising physician I was immediately available for consultation/collaboration.  Raeford Razor, MD 06/27/13 [phone]

## 2013-06-28 ENCOUNTER — Telehealth (HOSPITAL_COMMUNITY): Payer: Self-pay | Admitting: Emergency Medicine

## 2013-06-28 NOTE — ED Notes (Signed)
Post ED Visit - Positive Culture Follow-up  Culture report reviewed by antimicrobial stewardship pharmacist: []  Wes Dulaney, Pharm.D., BCPS [x]  Celedonio Miyamoto, Pharm.D., BCPS []  Georgina Pillion, Pharm.D., BCPS []  [place], [address].D., BCPS, AAHIVP []  Estella Husk, Pharm.D., BCPS, AAHIVP  Positive urine culture Per Heather VanWingen PA-C, would not treat since asymptomatic and no further patient follow-up is required at this time.  Kylie A Holland 06/28/2013, 1:17 PM

## 2013-10-21 ENCOUNTER — Encounter (HOSPITAL_COMMUNITY): Payer: Self-pay | Admitting: Emergency Medicine

## 2013-10-21 ENCOUNTER — Emergency Department (INDEPENDENT_AMBULATORY_CARE_PROVIDER_SITE_OTHER)
Admission: EM | Admit: 2013-10-21 | Discharge: 2013-10-21 | Disposition: A | Discharge status: Home / Self Care | Payer: BC Managed Care – PPO | Source: Home / Self Care | Attending: Emergency Medicine | Admitting: Emergency Medicine

## 2013-10-21 DIAGNOSIS — G8929 Other chronic pain: Secondary | ICD-10-CM

## 2013-10-21 DIAGNOSIS — M549 Dorsalgia, unspecified: Secondary | ICD-10-CM

## 2013-10-21 MED ORDER — KETOROLAC TROMETHAMINE 60 MG/2ML IM SOLN
INTRAMUSCULAR | Status: AC
  Filled 2013-10-21: qty 2

## 2013-10-21 MED ORDER — KETOROLAC TROMETHAMINE 60 MG/2ML IM SOLN
60.0000 mg | Freq: Once | INTRAMUSCULAR | Status: AC
  Administered 2013-10-21: 60 mg via INTRAMUSCULAR

## 2013-10-21 MED ORDER — TRAMADOL HCL 50 MG PO TABS: 50.0000 mg | ORAL_TABLET | Freq: Four times a day (QID) | ORAL | Status: DC | PRN

## 2013-10-21 NOTE — ED Provider Notes (Signed)
Medical screening examination/treatment/procedure(s) were performed by non-physician practitioner and as supervising physician I was immediately available for consultation/collaboration.  Katlin Ciszewski, M.D.  Silver Parkey C Lille Karim, MD 10/21/13 1443 

## 2013-10-21 NOTE — ED Provider Notes (Signed)
CSN: [id number]     Arrival date & time 10/21/13  1324 History   First MD Initiated Contact with Patient 10/21/13 0945     Chief Complaint  Patient presents with  . Medication Refill   (Consider location/radiation/quality/duration/timing/severity/associated sxs/prior Treatment) HPI Comments: Pt with chronic back pain. No change in typical symptoms. Here because ran out of tramadol 2 days prior to being able to refill prescription. Has a refill from Dr. Nehemiah Settle, her pcp.  Is requesting shot of toradol to help her get through today. Has appt with [facility] 1/27 for further eval of chronic back pain.   Patient is a 48 y.o. female presenting with back pain. The history is provided by the patient.  Back Pain Location:  Lumbar spine Quality:  Stabbing Pain severity:  Severe Pain is:  Same all the time Duration: many years. Timing:  Constant Progression:  Unchanged Chronicity:  Chronic Context comment:  No recent change in back pain Relieved by: own med. Associated symptoms: numbness and tingling   Associated symptoms: no bladder incontinence, no bowel incontinence, no fever and no weakness     Past Medical History  Diagnosis Date  . Back pain    Past Surgical History  Procedure Laterality Date  . Abdominal hysterectomy     History reviewed. No pertinent family history. History  Substance Use Topics  . Smoking status: Never Smoker   . Smokeless tobacco: Not on file  . Alcohol Use: No   OB History   Grav Para Term Preterm Abortions TAB SAB Ect Mult Living                 Review of Systems  Constitutional: Negative for fever and chills.  Gastrointestinal: Negative for bowel incontinence.  Genitourinary: Negative for bladder incontinence.  Musculoskeletal: Positive for back pain.  Neurological: Positive for tingling and numbness. Negative for weakness.    Allergies  Review of patient's allergies indicates no known allergies.  Home Medications   Current  Outpatient Rx  Name  Route  Sig  Dispense  Refill  . cetirizine (ZYRTEC) 10 MG tablet   Oral   Take 20 mg by mouth daily.          . methocarbamol (ROBAXIN) 500 MG tablet   Oral   Take 1,000 mg by mouth 4 (four) times daily.         . Misc Natural Products (COLON CLEANSER PO)   Oral   Take 3 capsules by mouth once a week.         . traMADol (ULTRAM) 50 MG tablet   Oral   Take 50-100 mg by mouth every 8 (eight) hours as needed for pain.         . traMADol (ULTRAM) 50 MG tablet   Oral   Take 1 tablet (50 mg total) by mouth every 8 (eight) hours as needed for pain.   15 tablet   0   . traMADol (ULTRAM) 50 MG tablet   Oral   Take 1 tablet (50 mg total) by mouth every 6 (six) hours as needed.   15 tablet   0    BP 115/73  Pulse 78  Temp(Src) 99 F (37.2 C) (Oral)  Resp 16  SpO2 100% Physical Exam  Constitutional: She is oriented to person, place, and time. She appears well-developed and well-nourished. No distress.  Neurological: She is alert and oriented to person, place, and time. Gait normal.    ED Course  Procedures (including critical care  time) Labs Review Labs Reviewed - No data to display Imaging Review No results found.  EKG Interpretation    Date/Time:    Ventricular Rate:    PR Interval:    QRS Duration:   QT Interval:    QTC Calculation:   R Axis:     Text Interpretation:              MDM   1. Chronic back pain   rx tramadol 15mg  q6 hrs prn pain #15. Pt given toradol 60mg  [facility]      Cathlyn ParsonsAngela M Francille Wittmann, NP 10/21/13 [phone]

## 2013-10-21 NOTE — Discharge Instructions (Signed)
[facility].    Chronic Back Pain  When back pain lasts longer than 3 months, it is called chronic back pain.People with chronic back pain often go through certain periods that are more intense (flare-ups).  CAUSES Chronic back pain can be caused by wear and tear (degeneration) on different structures in your back. These structures include:  The bones of your spine (vertebrae) and the joints surrounding your spinal cord and nerve roots (facets).  The strong, fibrous tissues that connect your vertebrae (ligaments). Degeneration of these structures may result in pressure on your nerves. This can lead to constant pain. HOME CARE INSTRUCTIONS  Avoid bending, heavy lifting, prolonged sitting, and activities which make the problem worse.  Take brief periods of rest throughout the day to reduce your pain. Lying down or standing usually is better than sitting while you are resting.  Take over-the-counter or prescription medicines only as directed by your caregiver. SEEK IMMEDIATE MEDICAL CARE IF:   You have weakness or numbness in one of your legs or feet.  You have trouble controlling your bladder or bowels.  You have nausea, vomiting, abdominal pain, shortness of breath, or fainting. Document Released: 10/27/2004 Document Revised: 12/12/2011 Document Reviewed: 09/03/2011 [facility] Patient Information 2014 [place].

## 2013-10-21 NOTE — ED Notes (Signed)
Pt  Reports  Back  Pain  From  An  Old  Injury   She  Presents  With an  Empty  Bottle  Of trammadol  And  Is  Requesting a  Refill       She  Reports  Is  Getting ongoing  Care

## 2013-11-27 ENCOUNTER — Other Ambulatory Visit (HOSPITAL_COMMUNITY)
Admission: RE | Admit: 2013-11-27 | Discharge: 2013-11-27 | Disposition: A | Discharge status: Home / Self Care | Payer: BC Managed Care – PPO | Source: Ambulatory Visit | Attending: Obstetrics & Gynecology | Admitting: Obstetrics & Gynecology

## 2013-11-27 ENCOUNTER — Other Ambulatory Visit: Payer: Self-pay | Admitting: Obstetrics and Gynecology

## 2013-11-27 DIAGNOSIS — Z01419 Encounter for gynecological examination (general) (routine) without abnormal findings: Secondary | ICD-10-CM | POA: Insufficient documentation

## 2013-11-27 DIAGNOSIS — Z1151 Encounter for screening for human papillomavirus (HPV): Secondary | ICD-10-CM | POA: Insufficient documentation

## 2014-03-24 ENCOUNTER — Other Ambulatory Visit: Payer: Self-pay

## 2014-03-24 DIAGNOSIS — Z1231 Encounter for screening mammogram for malignant neoplasm of breast: Secondary | ICD-10-CM

## 2014-04-09 ENCOUNTER — Ambulatory Visit
Admission: RE | Admit: 2014-04-09 | Discharge: 2014-04-09 | Disposition: A | Discharge status: Home / Self Care | Payer: BC Managed Care – PPO | Source: Ambulatory Visit

## 2014-04-09 DIAGNOSIS — Z1231 Encounter for screening mammogram for malignant neoplasm of breast: Secondary | ICD-10-CM

## 2014-08-29 ENCOUNTER — Encounter (HOSPITAL_COMMUNITY): Payer: Self-pay | Admitting: Emergency Medicine

## 2014-08-29 ENCOUNTER — Emergency Department (INDEPENDENT_AMBULATORY_CARE_PROVIDER_SITE_OTHER)
Admission: EM | Admit: 2014-08-29 | Discharge: 2014-08-29 | Disposition: A | Discharge status: Home / Self Care | Payer: BC Managed Care – PPO | Source: Home / Self Care | Attending: Emergency Medicine | Admitting: Emergency Medicine

## 2014-08-29 DIAGNOSIS — R232 Flushing: Secondary | ICD-10-CM

## 2014-08-29 DIAGNOSIS — N951 Menopausal and female climacteric states: Secondary | ICD-10-CM

## 2014-08-29 DIAGNOSIS — G8929 Other chronic pain: Secondary | ICD-10-CM

## 2014-08-29 DIAGNOSIS — M549 Dorsalgia, unspecified: Secondary | ICD-10-CM

## 2014-08-29 MED ORDER — TRAMADOL HCL 50 MG PO TABS: 50.0000 mg | ORAL_TABLET | Freq: Four times a day (QID) | ORAL | Status: DC | PRN

## 2014-08-29 MED ORDER — PAROXETINE HCL 20 MG PO TABS: 10.0000 mg | ORAL_TABLET | Freq: Every day | ORAL | Status: DC

## 2014-08-29 NOTE — ED Notes (Signed)
Pt reports she inj her back last night while helping her mother out of the tub Hx of chronic back pain; has appt w/[facility] next week and is seeing a Chiropractor Has run out of Tramadol 50 mg.... Would also like a refill on Paroxetine 10 mg  Alert, no signs of acute distress; steady gait; NAD

## 2014-08-29 NOTE — Discharge Instructions (Signed)
Take medication as directed and keep scheduled appointments with Dr Rolena Infante and [facility] as scheduled.   Back Injury Prevention The following tips can help you to prevent a back injury. PHYSICAL FITNESS  Exercise often. Try to develop strong stomach (abdominal) muscles.  Do aerobic exercises often. This includes walking, jogging, biking, swimming.  Do exercises that help with balance and strength often. This includes tai chi and yoga.  Stretch before and after you exercise.  Keep a healthy weight. DIET   Ask your doctor how much calcium and vitamin D you need every day.  Include calcium in your diet. Foods high in calcium include dairy products; green, leafy vegetables; and products with calcium added (fortified).  Include vitamin D in your diet. Foods high in vitamin D include milk and products with vitamin D added.  Think about taking a multivitamin or other nutritional products called " supplements."  Stop smoking if you smoke. POSTURE   Sit and stand up straight. Avoid leaning forward or hunching over.  Choose chairs that support your lower back.  If you work at a desk:  Sit close to your work so you do not lean over.  Keep your chin tucked in.  Keep your neck drawn back.  Keep your elbows bent at a right angle. Your arms should look like the letter "L."  Sit high and close to the steering wheel when you drive. Add low back support to your car seat if needed.  Avoid sitting or standing in one position for too long. Get up and move around every hour. Take breaks if you are driving for a long time.  Sleep on your side with your knees slightly bent. You can also sleep on your back with a pillow under your knees. Do not sleep on your stomach. LIFTING, TWISTING, AND REACHING  Avoid heavy lifting, especially lifting over and over again. If you must do heavy lifting:  Stretch before lifting.  Work slowly.  Rest between lifts.  Use carts and dollies to move objects  when possible.  Make several small trips instead of carrying 1 heavy load.  Ask for help when you need it.  Ask for help when moving big, awkward objects.  Follow these steps when lifting:  Stand with your feet shoulder-width apart.  Get as close to the object as you can. Do not pick up heavy objects that are far from your body.  Use handles or lifting straps when possible.  Bend at your knees. Squat down, but keep your heels off the floor.  Keep your shoulders back, your chin tucked in, and your back straight.  Lift the object slowly. Tighten the muscles in your legs, stomach, and butt. Keep the object as close to the center of your body as possible.  Reverse these directions when you put a load down.  Do not:  Lift the object above your waist.  Twist at the waist while lifting or carrying a load. Move your feet if you need to turn, not your waist.  Bend over without bending at your knees.  Avoid reaching over your head, across a table, or for an object on a high surface. OTHER TIPS  Avoid wet floors and keep sidewalks clear of ice.  Do not sleep on a mattress that is too soft or too hard.  Keep items that you use often within easy reach.  Put heavier objects on shelves at waist level. Put lighter objects on lower or higher shelves.  Find ways to lessen  your stress. You can try exercise, massage, or relaxation.  Get help for depression or anxiety if needed. GET HELP IF:  You injure your back.  You have questions about diet, exercise, or other ways to prevent back injuries. MAKE SURE YOU:  Understand these instructions.  Will watch your condition.  Will get help right away if you are not doing well or get worse. Document Released: 03/07/2008 Document Revised: 12/12/2011 Document Reviewed: 10/31/2011 [facility] Patient Information 2015 [place], [place]. This information is not intended to replace advice given to you by your health care provider. Make sure you  discuss any questions you have with your health care provider.  Chronic Back Pain  When back pain lasts longer than 3 months, it is called chronic back pain.People with chronic back pain often go through certain periods that are more intense (flare-ups).  CAUSES Chronic back pain can be caused by wear and tear (degeneration) on different structures in your back. These structures include:  The bones of your spine (vertebrae) and the joints surrounding your spinal cord and nerve roots (facets).  The strong, fibrous tissues that connect your vertebrae (ligaments). Degeneration of these structures may result in pressure on your nerves. This can lead to constant pain. HOME CARE INSTRUCTIONS  Avoid bending, heavy lifting, prolonged sitting, and activities which make the problem worse.  Take brief periods of rest throughout the day to reduce your pain. Lying down or standing usually is better than sitting while you are resting.  Take over-the-counter or prescription medicines only as directed by your caregiver. SEEK IMMEDIATE MEDICAL CARE IF:   You have weakness or numbness in one of your legs or feet.  You have trouble controlling your bladder or bowels.  You have nausea, vomiting, abdominal pain, shortness of breath, or fainting. Document Released: 10/27/2004 Document Revised: 12/12/2011 Document Reviewed: 09/03/2011 [facility] Patient Information 2015 [place], [place]. This information is not intended to replace advice given to you by your health care provider. Make sure you discuss any questions you have with your health care provider.

## 2014-08-29 NOTE — ED Provider Notes (Signed)
CSN: [id number]     Arrival date & time 08/29/14  1630 History   First MD Initiated Contact with Patient 08/29/14 1811     Chief Complaint  Patient presents with  . Back Pain    The history is provided by the patient.  Pt with chronic back pain. No change in typical symptoms. Reports pain is intermittent. Last night she attempted to assist her mother out of the bath tub and pain started to worsen. No numbness or tingling or new sx's. She took her last Tramadol last night.  Is requesting a refill of her Tramadol and Paroxetine. She is scheduled to see Dr Nehemiah SettlePolite her PCP in 10/2014 and will get refills of her Paroxetine. Pt is scheduled to see Ortho MD (Dr Shon BatonBrooks[facility].     Past Medical History  Diagnosis Date  . Back pain    Past Surgical History  Procedure Laterality Date  . Abdominal hysterectomy     No family history on file. History  Substance Use Topics  . Smoking status: Never Smoker   . Smokeless tobacco: Not on file  . Alcohol Use: No   OB History    No data available     Review of Systems  All other systems reviewed and are negative.   Allergies  Review of patient's allergies indicates no known allergies.  Home Medications   Prior to Admission medications   Medication Sig Start Date End Date Taking? Authorizing Provider  cetirizine (ZYRTEC) 10 MG tablet Take 20 mg by mouth daily.     Historical Provider, MD  methocarbamol (ROBAXIN) 500 MG tablet Take 1,000 mg by mouth 4 (four) times daily.    Historical Provider, MD  Misc Natural Products (COLON CLEANSER PO) Take 3 capsules by mouth once a week.    Historical Provider, MD  PARoxetine (PAXIL) 20 MG tablet Take 0.5 tablets (10 mg total) by mouth daily. 08/29/14   Roma KayserKatherine P Schorr, NP  traMADol (ULTRAM) 50 MG tablet Take 1 tablet (50 mg total) by mouth every 6 (six) hours as needed for moderate pain or severe pain. 08/29/14   Roma KayserKatherine P Schorr, NP   BP 115/76 mmHg  Pulse 68  Temp(Src)  98.4 F (36.9 C) (Oral)  Resp 14  SpO2 97% Physical Exam  Constitutional: She is oriented to person, place, and time. She appears well-developed and well-nourished.  HENT:  Head: Normocephalic and atraumatic.  Eyes: Conjunctivae are normal.  Cardiovascular: Normal rate.   Pulmonary/Chest: Effort normal.  Neurological: She is alert and oriented to person, place, and time. Gait normal.  Psychiatric: She has a normal mood and affect.  Nursing note and vitals reviewed.   ED Course  Procedures (including critical care time) Labs Review Labs Reviewed - No data to display  Imaging Review No results found.   MDM   1. Chronic back pain   2. Hot flashes    Trmadol 50 Q6H PRN LBP PAroxetine 10 mg QAM Keep scheduled appointments w/ PCP and ortho MD as scheduled.    Leanne ChangKatherine P Schorr, NP 08/29/14 1839

## 2014-10-10 ENCOUNTER — Ambulatory Visit: Payer: BC Managed Care – PPO | Admitting: Internal Medicine

## 2014-10-13 ENCOUNTER — Other Ambulatory Visit (INDEPENDENT_AMBULATORY_CARE_PROVIDER_SITE_OTHER): Payer: BC Managed Care – PPO

## 2014-10-13 ENCOUNTER — Encounter: Payer: Self-pay | Admitting: Internal Medicine

## 2014-10-13 ENCOUNTER — Ambulatory Visit (INDEPENDENT_AMBULATORY_CARE_PROVIDER_SITE_OTHER): Payer: BC Managed Care – PPO | Admitting: Internal Medicine

## 2014-10-13 VITALS — BP 104/68 | HR 86 | Temp 98.3°F | Resp 14 | Ht 67.0 in | Wt 154.0 lb

## 2014-10-13 DIAGNOSIS — M545 Low back pain, unspecified: Secondary | ICD-10-CM

## 2014-10-13 DIAGNOSIS — Z0000 Encounter for general adult medical examination without abnormal findings: Secondary | ICD-10-CM

## 2014-10-13 DIAGNOSIS — G4700 Insomnia, unspecified: Secondary | ICD-10-CM

## 2014-10-13 DIAGNOSIS — N951 Menopausal and female climacteric states: Secondary | ICD-10-CM

## 2014-10-13 DIAGNOSIS — R232 Flushing: Secondary | ICD-10-CM

## 2014-10-13 LAB — CBC
HCT: 37.8 % (ref 36.0–46.0)
HEMOGLOBIN: 12.4 g/dL (ref 12.0–15.0)
MCHC: 32.9 g/dL (ref 30.0–36.0)
MCV: 86 fl (ref 78.0–100.0)
PLATELETS: 239 10*3/uL (ref 150.0–400.0)
RBC: 4.39 Mil/uL (ref 3.87–5.11)
RDW: 13 % (ref 11.5–15.5)
WBC: 6.6 10*3/uL (ref 4.0–10.5)

## 2014-10-13 MED ORDER — PAROXETINE HCL 20 MG PO TABS: 10.0000 mg | ORAL_TABLET | Freq: Every day | ORAL | Status: DC

## 2014-10-13 MED ORDER — SUVOREXANT 5 MG PO TABS: 5.0000 mg | ORAL_TABLET | Freq: Every evening | ORAL | Status: DC | PRN

## 2014-10-13 NOTE — Patient Instructions (Signed)
We have sent in the paroxetine and will have you try belsomra for sleep. Take 1 pill about 30 minutes before bed time to help you fall asleep more naturally.   We will check your blood work and call you back with the results.   Come back in about 6-12 months for a check up.  Remember to get your mammogram this summer and sometime after you turn 50 you will need a colonoscopy.   Health Maintenance Adopting a healthy lifestyle and getting preventive care can go a long way to promote health and wellness. Talk with your health care provider about what schedule of regular examinations is right for you. This is a good chance for you to check in with your provider about disease prevention and staying healthy. In between checkups, there are plenty of things you can do on your own. Experts have done a lot of research about which lifestyle changes and preventive measures are most likely to keep you healthy. Ask your health care provider for more information. WEIGHT AND DIET  Eat a healthy diet  Be sure to include plenty of vegetables, fruits, low-fat dairy products, and lean protein.  Do not eat a lot of foods high in solid fats, added sugars, or salt.  Get regular exercise. This is one of the most important things you can do for your health.  Most adults should exercise for at least 150 minutes each week. The exercise should increase your heart rate and make you sweat (moderate-intensity exercise).  Most adults should also do strengthening exercises at least twice a week. This is in addition to the moderate-intensity exercise.  Maintain a healthy weight  Body mass index (BMI) is a measurement that can be used to identify possible weight problems. It estimates body fat based on height and weight. Your health care provider can help determine your BMI and help you achieve or maintain a healthy weight.  For females 46 years of age and older:   A BMI below 18.5 is considered underweight.  A BMI of  18.5 to 24.9 is normal.  A BMI of 25 to 29.9 is considered overweight.  A BMI of 30 and above is considered obese.  Watch levels of cholesterol and blood lipids  You should start having your blood tested for lipids and cholesterol at 50 years of age, then have this test every 5 years.  You may need to have your cholesterol levels checked more often if:  Your lipid or cholesterol levels are high.  You are older than 50 years of age.  You are at high risk for heart disease.  CANCER SCREENING   Lung Cancer  Lung cancer screening is recommended for adults 74-73 years old who are at high risk for lung cancer because of a history of smoking.  A yearly low-dose CT scan of the lungs is recommended for people who:  Currently smoke.  Have quit within the past 15 years.  Have at least a 30-pack-year history of smoking. A pack year is smoking an average of one pack of cigarettes a day for 1 year.  Yearly screening should continue until it has been 15 years since you quit.  Yearly screening should stop if you develop a health problem that would prevent you from having lung cancer treatment.  Breast Cancer  Practice breast self-awareness. This means understanding how your breasts normally appear and feel.  It also means doing regular breast self-exams. Let your health care provider know about any changes, no matter  how small.  If you are in your 20s or 30s, you should have a clinical breast exam (CBE) by a health care provider every 1-3 years as part of a regular health exam.  If you are 35 or older, have a CBE every year. Also consider having a breast X-ray (mammogram) every year.  If you have a family history of breast cancer, talk to your health care provider about genetic screening.  If you are at high risk for breast cancer, talk to your health care provider about having an MRI and a mammogram every year.  Breast cancer gene (BRCA) assessment is recommended for women who  have family members with BRCA-related cancers. BRCA-related cancers include:  Breast.  Ovarian.  Tubal.  Peritoneal cancers.  Results of the assessment will determine the need for genetic counseling and BRCA1 and BRCA2 testing. Cervical Cancer Routine pelvic examinations to screen for cervical cancer are no longer recommended for nonpregnant women who are considered low risk for cancer of the pelvic organs (ovaries, uterus, and vagina) and who do not have symptoms. A pelvic examination may be necessary if you have symptoms including those associated with pelvic infections. Ask your health care provider if a screening pelvic exam is right for you.   The Pap test is the screening test for cervical cancer for women who are considered at risk.  If you had a hysterectomy for a problem that was not cancer or a condition that could lead to cancer, then you no longer need Pap tests.  If you are older than 65 years, and you have had normal Pap tests for the past 10 years, you no longer need to have Pap tests.  If you have had past treatment for cervical cancer or a condition that could lead to cancer, you need Pap tests and screening for cancer for at least 20 years after your treatment.  If you no longer get a Pap test, assess your risk factors if they change (such as having a new sexual partner). This can affect whether you should start being screened again.  Some women have medical problems that increase their chance of getting cervical cancer. If this is the case for you, your health care provider may recommend more frequent screening and Pap tests.  The human papillomavirus (HPV) test is another test that may be used for cervical cancer screening. The HPV test looks for the virus that can cause cell changes in the cervix. The cells collected during the Pap test can be tested for HPV.  The HPV test can be used to screen women 68 years of age and older. Getting tested for HPV can extend the  interval between normal Pap tests from three to five years.  An HPV test also should be used to screen women of any age who have unclear Pap test results.  After 50 years of age, women should have HPV testing as often as Pap tests.  Colorectal Cancer  This type of cancer can be detected and often prevented.  Routine colorectal cancer screening usually begins at 50 years of age and continues through 50 years of age.  Your health care provider may recommend screening at an earlier age if you have risk factors for colon cancer.  Your health care provider may also recommend using home test kits to check for hidden blood in the stool.  A small camera at the end of a tube can be used to examine your colon directly (sigmoidoscopy or colonoscopy). This is done  to check for the earliest forms of colorectal cancer.  Routine screening usually begins at age 51.  Direct examination of the colon should be repeated every 5-10 years through 50 years of age. However, you may need to be screened more often if early forms of precancerous polyps or small growths are found. Skin Cancer  Check your skin from head to toe regularly.  Tell your health care provider about any new moles or changes in moles, especially if there is a change in a mole's shape or color.  Also tell your health care provider if you have a mole that is larger than the size of a pencil eraser.  Always use sunscreen. Apply sunscreen liberally and repeatedly throughout the day.  Protect yourself by wearing long sleeves, pants, a wide-brimmed hat, and sunglasses whenever you are outside. HEART DISEASE, DIABETES, AND HIGH BLOOD PRESSURE   Have your blood pressure checked at least every 1-2 years. High blood pressure causes heart disease and increases the risk of stroke.  If you are between 57 years and 10 years old, ask your health care provider if you should take aspirin to prevent strokes.  Have regular diabetes screenings. This  involves taking a blood sample to check your fasting blood sugar level.  If you are at a normal weight and have a low risk for diabetes, have this test once every three years after 50 years of age.  If you are overweight and have a high risk for diabetes, consider being tested at a younger age or more often. PREVENTING INFECTION  Hepatitis B  If you have a higher risk for hepatitis B, you should be screened for this virus. You are considered at high risk for hepatitis B if:  You were born in a country where hepatitis B is common. Ask your health care provider which countries are considered high risk.  Your parents were born in a high-risk country, and you have not been immunized against hepatitis B (hepatitis B vaccine).  You have HIV or AIDS.  You use needles to inject street drugs.  You live with someone who has hepatitis B.  You have had sex with someone who has hepatitis B.  You get hemodialysis treatment.  You take certain medicines for conditions, including cancer, organ transplantation, and autoimmune conditions. Hepatitis C  Blood testing is recommended for:  Everyone born from [birth year] through 1966.  Anyone with known risk factors for hepatitis C. Sexually transmitted infections (STIs)  You should be screened for sexually transmitted infections (STIs) including gonorrhea and chlamydia if:  You are sexually active and are younger than 50 years of age.  You are older than 50 years of age and your health care provider tells you that you are at risk for this type of infection.  Your sexual activity has changed since you were last screened and you are at an increased risk for chlamydia or gonorrhea. Ask your health care provider if you are at risk.  If you do not have HIV, but are at risk, it may be recommended that you take a prescription medicine daily to prevent HIV infection. This is called pre-exposure prophylaxis (PrEP). You are considered at risk if:  You are  sexually active and do not regularly use condoms or know the HIV status of your partner(s).  You take drugs by injection.  You are sexually active with a partner who has HIV. Talk with your health care provider about whether you are at high risk of being infected with  HIV. If you choose to begin PrEP, you should first be tested for HIV. You should then be tested every 3 months for as long as you are taking PrEP.  PREGNANCY   If you are premenopausal and you may become pregnant, ask your health care provider about preconception counseling.  If you may become pregnant, take 400 to 800 micrograms (mcg) of folic acid every day.  If you want to prevent pregnancy, talk to your health care provider about birth control (contraception). OSTEOPOROSIS AND MENOPAUSE   Osteoporosis is a disease in which the bones lose minerals and strength with aging. This can result in serious bone fractures. Your risk for osteoporosis can be identified using a bone density scan.  If you are 71 years of age or older, or if you are at risk for osteoporosis and fractures, ask your health care provider if you should be screened.  Ask your health care provider whether you should take a calcium or vitamin D supplement to lower your risk for osteoporosis.  Menopause may have certain physical symptoms and risks.  Hormone replacement therapy may reduce some of these symptoms and risks. Talk to your health care provider about whether hormone replacement therapy is right for you.  HOME CARE INSTRUCTIONS   Schedule regular health, dental, and eye exams.  Stay current with your immunizations.   Do not use any tobacco products including cigarettes, chewing tobacco, or electronic cigarettes.  If you are pregnant, do not drink alcohol.  If you are breastfeeding, limit how much and how often you drink alcohol.  Limit alcohol intake to no more than 1 drink per day for nonpregnant women. One drink equals 12 ounces of beer, 5  ounces of wine, or 1 ounces of hard liquor.  Do not use street drugs.  Do not share needles.  Ask your health care provider for help if you need support or information about quitting drugs.  Tell your health care provider if you often feel depressed.  Tell your health care provider if you have ever been abused or do not feel safe at home. Document Released: 04/04/2011 Document Revised: 02/03/2014 Document Reviewed: 08/21/2013 [facility] Patient Information 2015 [place], [place]. This information is not intended to replace advice given to you by your health care provider. Make sure you discuss any questions you have with your health care provider.

## 2014-10-13 NOTE — Progress Notes (Signed)
Pre visit review using our clinic review tool, if applicable. No additional management support is needed unless otherwise documented below in the visit note. 

## 2014-10-14 DIAGNOSIS — G4700 Insomnia, unspecified: Secondary | ICD-10-CM | POA: Insufficient documentation

## 2014-10-14 DIAGNOSIS — R232 Flushing: Secondary | ICD-10-CM | POA: Insufficient documentation

## 2014-10-14 DIAGNOSIS — M549 Dorsalgia, unspecified: Secondary | ICD-10-CM | POA: Insufficient documentation

## 2014-10-14 LAB — BASIC METABOLIC PANEL
BUN: 9 mg/dL (ref 6–23)
CO2: 31 mEq/L (ref 19–32)
Calcium: 9.2 mg/dL (ref 8.4–10.5)
Chloride: 103 mEq/L (ref 96–112)
Creatinine, Ser: 0.8 mg/dL (ref 0.4–1.2)
GFR: 99.23 mL/min (ref >60.00)
Glucose, Bld: 83 mg/dL (ref 70–99)
POTASSIUM: 3.6 meq/L (ref 3.5–5.1)
SODIUM: 138 meq/L (ref 135–145)

## 2014-10-14 LAB — LIPID PANEL
CHOL/HDL RATIO: 3
Cholesterol: 236 mg/dL — ABNORMAL HIGH (ref 0–200)
HDL: 68.8 mg/dL (ref >39.00)
LDL Cholesterol: 142 mg/dL — ABNORMAL HIGH (ref 0–99)
NONHDL: 167.2
TRIGLYCERIDES: 127 mg/dL (ref 0.0–149.0)
VLDL: 25.4 mg/dL (ref 0.0–40.0)

## 2014-10-14 NOTE — Progress Notes (Signed)
   Subjective:    Patient ID: Sally Kidd, female    DOB: 05/12/1966, 49 y.o.   MRN: [id number]  HPI The patient is a 49 YO female who is coming in today to establish care. She has PMH of hot flashes (treated with paxil), alergies, insomnia. She is still struggling with sleep and is not sure what she should try. She has tried some over the counter medicines and isn't sure she should keep taking them. She is currently using a benadryl in the evening to help herself get to sleep. She is doing well with allergies and only takes medicine in the fall and spring. She denies chest pains, SOB, abdominal pain, GERD. She does have back pain and has used tramadol in the past and follows with orthopedics. She is also starting with exercises and is hopeful that this will help with the pain and she will be in less pain.   Review of Systems  Constitutional: Negative for fever, chills, activity change, appetite change, fatigue and unexpected weight change.  HENT: Negative.   Respiratory: Negative for cough, chest tightness, shortness of breath and wheezing.   Cardiovascular: Negative for chest pain, palpitations and leg swelling.  Gastrointestinal: Negative for abdominal pain, diarrhea, constipation and abdominal distention.  Musculoskeletal: Positive for back pain and arthralgias. Negative for myalgias and gait problem.  Skin: Negative.   Neurological: Negative for dizziness, weakness, light-headedness and headaches.  Psychiatric/Behavioral: Positive for sleep disturbance. Negative for dysphoric mood and decreased concentration. The patient is not hyperactive.       Objective:   Physical Exam  Constitutional: She is oriented to person, place, and time. She appears well-developed and well-nourished.  HENT:  Head: Normocephalic and atraumatic.  Eyes: EOM are normal.  Neck: Normal range of motion.  Cardiovascular: Normal rate and regular rhythm.   Pulmonary/Chest: Effort normal and breath sounds  normal. No respiratory distress. She has no wheezes. She has no rales.  Abdominal: Soft. Bowel sounds are normal. She exhibits no distension. There is no tenderness. There is no rebound.  Musculoskeletal: She exhibits no edema.  Neurological: She is alert and oriented to person, place, and time. Coordination normal.  Skin: Skin is warm and dry.   Filed Vitals:   10/13/14 1513  BP: 104/68  Pulse: 86  Temp: 98.3 F (36.8 C)  TempSrc: Oral  Resp: 14  Height: 5\' 7"  (1.702 m)  Weight: 154 lb (69.854 kg)  SpO2: 96%      Assessment & Plan:

## 2014-10-14 NOTE — Assessment & Plan Note (Signed)
Will trial belsomra. Does not want to try ambien. If belsomra unsuccessful will resume benadryl for sleep.

## 2014-10-14 NOTE — Assessment & Plan Note (Signed)
She is currently involved in physical therapy and is hopeful that she will be in less pain in the future.

## 2014-10-14 NOTE — Assessment & Plan Note (Signed)
Takes mild dose of paxil for hot flashes which seems to help.

## 2014-10-28 ENCOUNTER — Telehealth: Payer: Self-pay | Admitting: Internal Medicine

## 2014-10-28 NOTE — Telephone Encounter (Signed)
Patient said the doctor did not give her a prescription for the Paroxetine. Also said her insurance company [employer] wants to know more about the sleep aid medication she was given.  It has something to do with the cost. They are thinking she could get another one instead.

## 2014-10-28 NOTE — Telephone Encounter (Signed)
I spoke with patient and let her know that the medication was sent to the pharmacy. She said she would try belsomra, but she prefers Ambien.

## 2014-10-28 NOTE — Telephone Encounter (Signed)
Medication was faxed to pharmacy. I have not received a PA request for the sleep medication.

## 2014-10-29 ENCOUNTER — Other Ambulatory Visit: Payer: Self-pay | Admitting: Geriatric Medicine

## 2014-10-29 MED ORDER — PAROXETINE HCL 20 MG PO TABS: 10.0000 mg | ORAL_TABLET | Freq: Every day | ORAL | Status: DC

## 2014-12-15 ENCOUNTER — Telehealth: Payer: Self-pay | Admitting: Internal Medicine

## 2014-12-15 NOTE — Telephone Encounter (Signed)
Pt called stated that BELSOMRA need to be start on PA, please call [phone]. Pt use [facility]

## 2015-03-08 ENCOUNTER — Emergency Department (INDEPENDENT_AMBULATORY_CARE_PROVIDER_SITE_OTHER)
Admission: EM | Admit: 2015-03-08 | Discharge: 2015-03-08 | Disposition: A | Discharge status: Home / Self Care | Payer: Self-pay | Source: Home / Self Care | Attending: Family Medicine | Admitting: Family Medicine

## 2015-03-08 ENCOUNTER — Encounter (HOSPITAL_COMMUNITY): Payer: Self-pay | Admitting: *Deleted

## 2015-03-08 DIAGNOSIS — S0093XA Contusion of unspecified part of head, initial encounter: Secondary | ICD-10-CM

## 2015-03-08 NOTE — Discharge Instructions (Signed)
Ice and advil and regular diet and activity as tolerated, return if needed.

## 2015-03-08 NOTE — ED Notes (Signed)
Pt  Reports    She  Felled    And    Hit  Back  Of  Her  Head      When         She   Was  Running              From   A  Dog           She  Reports  A  Headache      She   denys  Any  Vomiting

## 2015-03-08 NOTE — ED Provider Notes (Addendum)
CSN: [id number]     Arrival date & time 03/08/15  1306 History   First MD Initiated Contact with Patient 03/08/15 1412     Chief Complaint  Patient presents with  . Head Injury   (Consider location/radiation/quality/duration/timing/severity/associated sxs/prior Treatment) Patient is a 49 y.o. female presenting with head injury. The history is provided by the patient.  Head Injury Location:  Occipital Time since incident:  1 day Mechanism of injury: direct blow and fall   Mechanism of injury comment:  Being chased by dog around 5pm yest and fell striking back of head, no loc or lac, sx have gradually been improving, no n/v, slept well, no dizziness or confusion. Pain details:    Quality:  Dull   Progression:  Partially resolved Chronicity:  New Relieved by:  None tried Worsened by:  Nothing tried Ineffective treatments:  None tried Associated symptoms: no blurred vision, no loss of consciousness, no nausea, no neck pain, no numbness and no vomiting   Risk factors comment:  Anxious   Past Medical History  Diagnosis Date  . Back pain    Past Surgical History  Procedure Laterality Date  . Abdominal hysterectomy     Family History  Problem Relation Age of Onset  . Diabetes Mother   . Hyperlipidemia Mother   . Cancer Father     prostate  . Diabetes Sister   . Hyperlipidemia Sister    History  Substance Use Topics  . Smoking status: Never Smoker   . Smokeless tobacco: Not on file  . Alcohol Use: No   OB History    No data available     Review of Systems  Constitutional: Negative.   Eyes: Negative for blurred vision.  Gastrointestinal: Negative.  Negative for nausea and vomiting.  Musculoskeletal: Negative for neck pain.  Neurological: Negative for dizziness, loss of consciousness, speech difficulty, weakness, light-headedness and numbness.    Allergies  Review of patient's allergies indicates no known allergies.  Home Medications   Prior to Admission  medications   Medication Sig Start Date End Date Taking? Authorizing Provider  cetirizine (ZYRTEC) 10 MG tablet Take 20 mg by mouth daily.     Historical Provider, MD  methocarbamol (ROBAXIN) 500 MG tablet Take 1,000 mg by mouth 4 (four) times daily.    Historical Provider, MD  Misc Natural Products (COLON CLEANSER PO) Take 3 capsules by mouth once a week.    Historical Provider, MD  PARoxetine (PAXIL) 20 MG tablet Take 0.5 tablets (10 mg total) by mouth daily. 10/29/14   Judie Bonus, MD  Suvorexant (BELSOMRA) 5 MG TABS Take 5 mg by mouth at bedtime as needed (slep). 10/13/14   Judie Bonus, MD  traMADol (ULTRAM) 50 MG tablet Take 1 tablet (50 mg total) by mouth every 6 (six) hours as needed for moderate pain or severe pain. 08/29/14   Roma Kayser Schorr, NP   BP 130/78 mmHg  Pulse 78  Temp(Src) 98.6 F (37 C) (Oral)  SpO2 100% Physical Exam  Constitutional: She is oriented to person, place, and time. She appears well-developed and well-nourished.  HENT:  Head: Normocephalic and atraumatic.  Right Ear: External ear normal.  Left Ear: External ear normal.  Mouth/Throat: Oropharynx is clear and moist.  Eyes: Conjunctivae and EOM are normal. Pupils are equal, round, and reactive to light.  Neck: Normal range of motion. Neck supple.  Cardiovascular: Normal heart sounds.   Pulmonary/Chest: Breath sounds normal.  Abdominal: Bowel sounds are normal. There is  no tenderness.  Musculoskeletal: Normal range of motion.  Lymphadenopathy:    She has no cervical adenopathy.  Neurological: She is alert and oriented to person, place, and time. No cranial nerve deficit. Coordination normal.  Skin: Skin is warm and dry.  Nursing note and vitals reviewed.   ED Course  Procedures (including critical care time) Labs Review Labs Reviewed - No data to display  Imaging Review No results found.   MDM   1. Head contusion, initial encounter        Linna HoffJames D Lashanda Storlie, MD 03/08/15  1434  Linna HoffJames D Zandrea Kenealy, MD 03/09/15 2046

## 2015-03-23 ENCOUNTER — Ambulatory Visit: Payer: BC Managed Care – PPO | Admitting: Internal Medicine

## 2015-03-31 ENCOUNTER — Other Ambulatory Visit: Payer: Self-pay | Admitting: Internal Medicine

## 2015-04-02 ENCOUNTER — Ambulatory Visit (INDEPENDENT_AMBULATORY_CARE_PROVIDER_SITE_OTHER): Payer: BC Managed Care – PPO | Admitting: Internal Medicine

## 2015-04-02 ENCOUNTER — Encounter: Payer: Self-pay | Admitting: Internal Medicine

## 2015-04-02 VITALS — BP 102/58 | HR 69 | Temp 98.1°F | Resp 14 | Ht 67.0 in | Wt 149.4 lb

## 2015-04-02 DIAGNOSIS — Z1211 Encounter for screening for malignant neoplasm of colon: Secondary | ICD-10-CM | POA: Diagnosis not present

## 2015-04-02 DIAGNOSIS — G4700 Insomnia, unspecified: Secondary | ICD-10-CM

## 2015-04-02 DIAGNOSIS — Z0000 Encounter for general adult medical examination without abnormal findings: Secondary | ICD-10-CM

## 2015-04-02 NOTE — Patient Instructions (Signed)
We will have the GI office call you to get the colonoscopy done.   Biotin is something that you can take for the hair and nail strength.   It is okay to keep taking the benadryl if it is doing a good job for sleep.

## 2015-04-02 NOTE — Progress Notes (Signed)
Pre visit review using our clinic review tool, if applicable. No additional management support is needed unless otherwise documented below in the visit note. 

## 2015-04-03 ENCOUNTER — Other Ambulatory Visit: Payer: Self-pay

## 2015-04-03 DIAGNOSIS — Z0000 Encounter for general adult medical examination without abnormal findings: Secondary | ICD-10-CM | POA: Insufficient documentation

## 2015-04-03 DIAGNOSIS — Z1231 Encounter for screening mammogram for malignant neoplasm of breast: Secondary | ICD-10-CM

## 2015-04-03 NOTE — Assessment & Plan Note (Signed)
Benadryl prn is fine for now. Would not pursue other medication as we talked about the side effects of some of these medicines and she wishes to avoid.

## 2015-04-03 NOTE — Progress Notes (Signed)
   Subjective:    Patient ID: Sally Kidd, female    DOB: 09/10/1966, 49 y.o.   MRN: [id number]  HPI The patient is a 49 YO female coming in for colonoscopy referral. She was not able to get the belsomra for sleep and has been using benadryl successfully. She wants to know if this is okay or if she should try something else instead. Doesn't feel groggy in the morning. Still suffering with her back pain and keeping up with massage monthly and exercising daily. Working on healthy diet to make her feel better.   Review of Systems  Constitutional: Negative for fever, chills, activity change, appetite change, fatigue and unexpected weight change.  Respiratory: Negative for cough, chest tightness, shortness of breath and wheezing.   Cardiovascular: Negative for chest pain, palpitations and leg swelling.  Gastrointestinal: Negative for abdominal pain, diarrhea, constipation and abdominal distention.  Musculoskeletal: Positive for back pain and arthralgias. Negative for myalgias and gait problem.  Skin: Negative.   Neurological: Negative for dizziness, weakness, light-headedness and headaches.  Psychiatric/Behavioral: Negative for sleep disturbance, dysphoric mood and decreased concentration. The patient is not hyperactive.       Objective:   Physical Exam  Constitutional: She is oriented to person, place, and time. She appears well-developed and well-nourished.  HENT:  Head: Normocephalic and atraumatic.  Eyes: EOM are normal.  Neck: Normal range of motion.  Cardiovascular: Normal rate and regular rhythm.   Pulmonary/Chest: Effort normal and breath sounds normal. No respiratory distress. She has no wheezes. She has no rales.  Abdominal: Soft. Bowel sounds are normal. She exhibits no distension. There is no tenderness. There is no rebound.  Musculoskeletal: She exhibits no edema.  Neurological: She is alert and oriented to person, place, and time. Coordination normal.  Skin: Skin is warm  and dry.   Filed Vitals:   04/02/15 1404  BP: 102/58  Pulse: 69  Temp: 98.1 F (36.7 C)  TempSrc: Oral  Resp: 14  Height: 5\' 7"  (1.702 m)  Weight: 149 lb 6.4 oz (67.767 kg)  SpO2: 98%      Assessment & Plan:

## 2015-04-03 NOTE — Assessment & Plan Note (Signed)
Referral for colonoscopy placed as patient is now 49. If possible she would like to have done in July so she can have it done before she goes back to school.

## 2015-04-07 ENCOUNTER — Ambulatory Visit (AMBULATORY_SURGERY_CENTER): Payer: Self-pay

## 2015-04-07 VITALS — Ht 67.0 in | Wt 147.4 lb

## 2015-04-07 DIAGNOSIS — Z1211 Encounter for screening for malignant neoplasm of colon: Secondary | ICD-10-CM

## 2015-04-07 MED ORDER — NA SULFATE-K SULFATE-MG SULF 17.5-3.13-1.6 GM/177ML PO SOLN: ORAL | Status: DC

## 2015-04-07 NOTE — Progress Notes (Signed)
Per pt, no allergies to soy or egg products.Pt not taking any weight loss meds or using  O2 at home. 

## 2015-04-08 ENCOUNTER — Encounter: Payer: Self-pay | Admitting: Internal Medicine

## 2015-04-13 ENCOUNTER — Ambulatory Visit: Payer: BC Managed Care – PPO

## 2015-04-15 ENCOUNTER — Encounter: Payer: Self-pay | Admitting: Internal Medicine

## 2015-04-15 ENCOUNTER — Ambulatory Visit (AMBULATORY_SURGERY_CENTER): Payer: BC Managed Care – PPO | Admitting: Internal Medicine

## 2015-04-15 ENCOUNTER — Telehealth: Payer: Self-pay | Admitting: Internal Medicine

## 2015-04-15 VITALS — BP 114/70 | HR 58 | Temp 97.7°F | Resp 14 | Ht 67.0 in | Wt 147.0 lb

## 2015-04-15 DIAGNOSIS — Z1211 Encounter for screening for malignant neoplasm of colon: Secondary | ICD-10-CM | POA: Diagnosis not present

## 2015-04-15 MED ORDER — SODIUM CHLORIDE 0.9 % IV SOLN: 500.0000 mL | INTRAVENOUS | Status: DC

## 2015-04-15 NOTE — Patient Instructions (Signed)
Discharge instructions given. Normal exam. Resume previous medications. YOU HAD AN ENDOSCOPIC PROCEDURE TODAY AT THE [facility]:   Refer to the procedure report that was given to you for any specific questions about what was found during the examination.  If the procedure report does not answer your questions, please call your gastroenterologist to clarify.  If you requested that your care partner not be given the details of your procedure findings, then the procedure report has been included in a sealed envelope for you to review at your convenience later.  YOU SHOULD EXPECT: Some feelings of bloating in the abdomen. Passage of more gas than usual.  Walking can help get rid of the air that was put into your GI tract during the procedure and reduce the bloating. If you had a lower endoscopy (such as a colonoscopy or flexible sigmoidoscopy) you may notice spotting of blood in your stool or on the toilet paper. If you underwent a bowel prep for your procedure, you may not have a normal bowel movement for a few days.  Please Note:  You might notice some irritation and congestion in your nose or some drainage.  This is from the oxygen used during your procedure.  There is no need for concern and it should clear up in a day or so.  SYMPTOMS TO REPORT IMMEDIATELY:   Following lower endoscopy (colonoscopy or flexible sigmoidoscopy):  Excessive amounts of blood in the stool  Significant tenderness or worsening of abdominal pains  Swelling of the abdomen that is new, acute  Fever of 100F or higher   For urgent or emergent issues, a gastroenterologist can be reached at any hour by calling [phone].   DIET: Your first meal following the procedure should be a small meal and then it is ok to progress to your normal diet. Heavy or fried foods are harder to digest and may make you feel nauseous or bloated.  Likewise, meals heavy in dairy and vegetables can increase bloating.  Drink plenty  of fluids but you should avoid alcoholic beverages for 24 hours.  ACTIVITY:  You should plan to take it easy for the rest of today and you should NOT DRIVE or use heavy machinery until tomorrow (because of the sedation medicines used during the test).    FOLLOW UP: Our staff will call the number listed on your records the next business day following your procedure to check on you and address any questions or concerns that you may have regarding the information given to you following your procedure. If we do not reach you, we will leave a message.  However, if you are feeling well and you are not experiencing any problems, there is no need to return our call.  We will assume that you have returned to your regular daily activities without incident.  If any biopsies were taken you will be contacted by phone or by letter within the next 1-3 weeks.  Please call us at [phone] if you have not heard about the biopsies in 3 weeks.    SIGNATURES/CONFIDENTIALITY: You and/or your care partner have signed paperwork which will be entered into your electronic medical record.  These signatures attest to the fact that that the information above on your After Visit Summary has been reviewed and is understood.  Full responsibility of the confidentiality of this discharge information lies with you and/or your care-partner. 

## 2015-04-15 NOTE — Progress Notes (Signed)
Stable to RR 

## 2015-04-15 NOTE — Telephone Encounter (Signed)
Returned patient's call and she was not needing to know anything about her procedure.  She wanted to inform us that she has a service dog and has papers/card stating that.  She wanted to be sure it was okay to bring the dog as it has never been left at home alone.  I spoke with Karl BalesKristen Westbrook, RN and asked her. She states that by law we cannot refuse service dog to accompany patient.  I reassured patient that it was okay to bring dog and she was relieved.  All questions were answered.

## 2015-04-15 NOTE — Op Note (Signed)
[facility] [address] [place], [id number]   COLONOSCOPY PROCEDURE REPORT  PATIENT: Sally Kidd, Sally Kidd  MR#: [id number] BIRTHDATE: 11-24-1965 , 49  yrs. old GENDER: female ENDOSCOPIST: Hart Carwinora M Brodie, MD REFERRED GE:[id number]:Elizabeth Dorise HissKollar, M.D. PROCEDURE DATE:  04/15/2015 PROCEDURE:   Colonoscopy, screening First Screening Colonoscopy - Avg.  risk and is 50 yrs.  old or older Yes.  Prior Negative Screening - Now for repeat screening. N/A  History of Adenoma - Now for follow-up colonoscopy & has been > or = to 3 yrs.  N/A  Polyps removed today? No Recommend repeat exam, <10 yrs? No ASA CLASS:   Class I INDICATIONS:Screening for colonic neoplasia and Colorectal Neoplasm Risk Assessment for this procedure is average risk. MEDICATIONS: Monitored anesthesia care and Propofol 300 mg IV  DESCRIPTION OF PROCEDURE:   After the risks benefits and alternatives of the procedure were thoroughly explained, informed consent was obtained.  The digital rectal exam revealed no abnormalities of the rectum.   The LB [id number]-H190 [id number]  endoscope was introduced through the anus and advanced to the cecum, which was identified by both the appendix and ileocecal valve. No adverse events experienced.   The quality of the prep was good.  (MoviPrep was used)  The instrument was then slowly withdrawn as the colon was fully examined. Estimated blood loss is zero unless otherwise noted in this procedure report.      COLON FINDINGS: Extensive melanosis coli.  Retroflexed views revealed no abnormalities. The time to cecum = 8.47 Withdrawal time = 6.11   The scope was withdrawn and the procedure completed. COMPLICATIONS: There were no immediate complications.  ENDOSCOPIC IMPRESSION: Extensive melanosis coli  RECOMMENDATIONS: High fiber diet Stop taking herbal laxatives fiber supplements as needed Recall colonoscopy in 10 years  eSigned:  Hart Carwinora M Brodie, MD 04/15/2015 2:20  PM   cc:

## 2015-04-16 ENCOUNTER — Telehealth: Payer: Self-pay | Admitting: Emergency Medicine

## 2015-04-16 NOTE — Telephone Encounter (Signed)
  Follow up Call-  Call back number 04/15/2015  Post procedure Call Back phone  # [phone]  Permission to leave phone message Yes     Patient questions:  Do you have a fever, pain , or abdominal swelling? No. Pain Score  0 *  Have you tolerated food without any problems? Yes.    Have you been able to return to your normal activities? Yes.    Do you have any questions about your discharge instructions: Diet   No. Medications  No. Follow up visit  No.  Do you have questions or concerns about your Care? No.  Actions: * If pain score is 4 or above: No action needed, pain <4.

## 2015-04-20 ENCOUNTER — Ambulatory Visit
Admission: RE | Admit: 2015-04-20 | Discharge: 2015-04-20 | Disposition: A | Discharge status: Home / Self Care | Payer: BC Managed Care – PPO | Source: Ambulatory Visit

## 2015-04-20 DIAGNOSIS — Z1231 Encounter for screening mammogram for malignant neoplasm of breast: Secondary | ICD-10-CM

## 2015-05-25 ENCOUNTER — Other Ambulatory Visit: Payer: Self-pay | Admitting: Internal Medicine

## 2015-06-02 ENCOUNTER — Emergency Department (INDEPENDENT_AMBULATORY_CARE_PROVIDER_SITE_OTHER)
Admission: EM | Admit: 2015-06-02 | Discharge: 2015-06-02 | Disposition: A | Discharge status: Home / Self Care | Payer: BC Managed Care – PPO | Source: Home / Self Care | Attending: Emergency Medicine | Admitting: Emergency Medicine

## 2015-06-02 ENCOUNTER — Encounter (HOSPITAL_COMMUNITY): Payer: Self-pay | Admitting: Emergency Medicine

## 2015-06-02 DIAGNOSIS — M545 Low back pain: Secondary | ICD-10-CM

## 2015-06-02 DIAGNOSIS — G8929 Other chronic pain: Secondary | ICD-10-CM | POA: Diagnosis not present

## 2015-06-02 MED ORDER — TRAMADOL HCL 50 MG PO TABS: 50.0000 mg | ORAL_TABLET | Freq: Two times a day (BID) | ORAL | Status: DC
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
## 2015-06-02 NOTE — ED Notes (Signed)
Pt suffers from chronic back pain and is followed by [facility] orthopedics.  She missed her appointment with them on 8/11 and they are not willing to refill her pain prescriptions until she is seen 9/6.  Pt is here to get enough pain medication to get her through until her appointment.  She states she has been taking half doses of her medication, but took her last half dose today.  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

## 2015-06-02 NOTE — Discharge Instructions (Signed)

## 2015-07-04 ENCOUNTER — Other Ambulatory Visit: Payer: Self-pay | Admitting: Internal Medicine

## 2015-08-01 ENCOUNTER — Other Ambulatory Visit: Payer: Self-pay | Admitting: Internal Medicine

## 2015-08-29 ENCOUNTER — Other Ambulatory Visit: Payer: Self-pay | Admitting: Internal Medicine

## 2015-10-08 ENCOUNTER — Other Ambulatory Visit: Payer: Self-pay | Admitting: Internal Medicine

## 2015-11-10 ENCOUNTER — Other Ambulatory Visit: Payer: Self-pay | Admitting: Internal Medicine

## 2016-02-13 ENCOUNTER — Other Ambulatory Visit: Payer: Self-pay | Admitting: Internal Medicine

## 2016-03-22 ENCOUNTER — Other Ambulatory Visit: Payer: Self-pay | Admitting: Internal Medicine

## 2016-04-04 ENCOUNTER — Encounter: Payer: Self-pay | Admitting: Internal Medicine

## 2016-04-04 ENCOUNTER — Ambulatory Visit (INDEPENDENT_AMBULATORY_CARE_PROVIDER_SITE_OTHER): Payer: BC Managed Care – PPO | Admitting: Internal Medicine

## 2016-04-04 ENCOUNTER — Other Ambulatory Visit (INDEPENDENT_AMBULATORY_CARE_PROVIDER_SITE_OTHER): Payer: BC Managed Care – PPO

## 2016-04-04 VITALS — BP 116/68 | HR 84 | Temp 98.8°F | Resp 14 | Ht 67.0 in | Wt 166.0 lb

## 2016-04-04 DIAGNOSIS — G4700 Insomnia, unspecified: Secondary | ICD-10-CM | POA: Diagnosis not present

## 2016-04-04 DIAGNOSIS — Z0000 Encounter for general adult medical examination without abnormal findings: Secondary | ICD-10-CM

## 2016-04-04 LAB — COMPREHENSIVE METABOLIC PANEL
ALK PHOS: 58 U/L (ref 39–117)
ALT: 12 U/L (ref 0–35)
AST: 18 U/L (ref 0–37)
Albumin: 4.5 g/dL (ref 3.5–5.2)
BILIRUBIN TOTAL: 0.6 mg/dL (ref 0.2–1.2)
BUN: 8 mg/dL (ref 6–23)
CO2: 31 mEq/L (ref 19–32)
CREATININE: 0.87 mg/dL (ref 0.40–1.20)
Calcium: 9.7 mg/dL (ref 8.4–10.5)
Chloride: 104 mEq/L (ref 96–112)
GFR: 88.25 mL/min (ref >60.00)
GLUCOSE: 69 mg/dL — AB (ref 70–99)
POTASSIUM: 4 meq/L (ref 3.5–5.1)
SODIUM: 141 meq/L (ref 135–145)
Total Protein: 7.8 g/dL (ref 6.0–8.3)

## 2016-04-04 LAB — TSH: TSH: 1.32 u[IU]/mL (ref 0.35–4.50)

## 2016-04-04 LAB — CBC
HCT: 38.3 % (ref 36.0–46.0)
Hemoglobin: 13.1 g/dL (ref 12.0–15.0)
MCHC: 34.2 g/dL (ref 30.0–36.0)
MCV: 84.6 fl (ref 78.0–100.0)
Platelets: 246 K/uL (ref 150.0–400.0)
RBC: 4.53 Mil/uL (ref 3.87–5.11)
RDW: 12.8 % (ref 11.5–15.5)
WBC: 7.6 K/uL (ref 4.0–10.5)

## 2016-04-04 LAB — LIPID PANEL
Cholesterol: 241 mg/dL — ABNORMAL HIGH (ref 0–200)
HDL: 62 mg/dL
LDL Cholesterol: 147 mg/dL — ABNORMAL HIGH (ref 0–99)
NonHDL: 178.72
Total CHOL/HDL Ratio: 4
Triglycerides: 157 mg/dL — ABNORMAL HIGH (ref 0.0–149.0)
VLDL: 31.4 mg/dL (ref 0.0–40.0)

## 2016-04-04 LAB — HEMOGLOBIN A1C: Hgb A1c MFr Bld: 5.5 % (ref 4.6–6.5)

## 2016-04-04 MED ORDER — PAROXETINE HCL 10 MG PO TABS: ORAL_TABLET | ORAL | Status: DC

## 2016-04-04 MED ORDER — TRAZODONE HCL 50 MG PO TABS: 50.0000 mg | ORAL_TABLET | Freq: Every evening | ORAL | Status: DC | PRN

## 2016-04-04 NOTE — Progress Notes (Signed)
   Subjective:    Patient ID: Sally Kidd, female    DOB: 12/22/1965, 50 y.o.   MRN: [id number]  HPI The patient is a 50 YO female coming in for wellness. No new concerns. Got colonoscopy after last visit.   [facility], [facility], social history reviewed and updated.   Review of Systems  Constitutional: Negative for fever, chills, activity change, appetite change, fatigue and unexpected weight change.  HENT: Negative.   Eyes: Negative.   Respiratory: Negative for cough, chest tightness, shortness of breath and wheezing.   Cardiovascular: Negative for chest pain, palpitations and leg swelling.  Gastrointestinal: Negative for abdominal pain, diarrhea, constipation and abdominal distention.  Musculoskeletal: Positive for back pain and arthralgias. Negative for myalgias and gait problem.  Skin: Negative.   Neurological: Negative for dizziness, weakness, light-headedness and headaches.  Psychiatric/Behavioral: Positive for sleep disturbance. Negative for dysphoric mood and decreased concentration. The patient is not hyperactive.       Objective:   Physical Exam  Constitutional: She is oriented to person, place, and time. She appears well-developed and well-nourished.  HENT:  Head: Normocephalic and atraumatic.  Eyes: EOM are normal.  Neck: Normal range of motion.  Cardiovascular: Normal rate and regular rhythm.   Pulmonary/Chest: Effort normal and breath sounds normal. No respiratory distress. She has no wheezes. She has no rales.  Abdominal: Soft. Bowel sounds are normal. She exhibits no distension. There is no tenderness. There is no rebound.  Musculoskeletal: She exhibits no edema.  Neurological: She is alert and oriented to person, place, and time. Coordination normal.  Skin: Skin is warm and dry.  Psychiatric: She has a normal mood and affect.   Filed Vitals:   04/04/16 1427  BP: 116/68  Pulse: 84  Temp: 98.8 F (37.1 C)  TempSrc: Oral  Resp: 14  Height: 5\' 7"  (1.702 m)  Weight:  166 lb (75.297 kg)  SpO2: 98%      Assessment & Plan:

## 2016-04-04 NOTE — Patient Instructions (Signed)
We will check the labs today and call you back about the results.   We have sent in trazodone for the sleeping that you can try. It can take 1 week of taking before it is 100% in your system. This is not habit forming and not addictive.   Work on getting back into exercise.   Come back next year if everything is going well and please feel free to call with any problems or questions.

## 2016-04-04 NOTE — Assessment & Plan Note (Signed)
Rx for trazodone to try for sleep.

## 2016-04-04 NOTE — Progress Notes (Signed)
Pre visit review using our clinic review tool, if applicable. No additional management support is needed unless otherwise documented below in the visit note. 

## 2016-04-04 NOTE — Assessment & Plan Note (Signed)
Checking labs and ordered mammogram. Not exercising and will restart that. Overall doing well and eating well.

## 2016-04-07 ENCOUNTER — Other Ambulatory Visit: Payer: Self-pay | Admitting: Internal Medicine

## 2016-04-07 DIAGNOSIS — Z1231 Encounter for screening mammogram for malignant neoplasm of breast: Secondary | ICD-10-CM

## 2016-04-20 ENCOUNTER — Ambulatory Visit: Payer: BC Managed Care – PPO

## 2016-04-27 ENCOUNTER — Ambulatory Visit
Admission: RE | Admit: 2016-04-27 | Discharge: 2016-04-27 | Disposition: A | Discharge status: Home / Self Care | Payer: BC Managed Care – PPO | Source: Ambulatory Visit | Attending: Internal Medicine | Admitting: Internal Medicine

## 2016-04-27 DIAGNOSIS — Z1231 Encounter for screening mammogram for malignant neoplasm of breast: Secondary | ICD-10-CM

## 2017-03-22 ENCOUNTER — Other Ambulatory Visit: Payer: Self-pay | Admitting: Internal Medicine

## 2017-03-22 DIAGNOSIS — Z1231 Encounter for screening mammogram for malignant neoplasm of breast: Secondary | ICD-10-CM

## 2017-04-03 ENCOUNTER — Ambulatory Visit: Payer: BC Managed Care – PPO

## 2017-04-07 ENCOUNTER — Ambulatory Visit (INDEPENDENT_AMBULATORY_CARE_PROVIDER_SITE_OTHER): Payer: BC Managed Care – PPO | Admitting: Nurse Practitioner

## 2017-04-07 ENCOUNTER — Other Ambulatory Visit (INDEPENDENT_AMBULATORY_CARE_PROVIDER_SITE_OTHER): Payer: BC Managed Care – PPO

## 2017-04-07 ENCOUNTER — Encounter: Payer: Self-pay | Admitting: Nurse Practitioner

## 2017-04-07 VITALS — BP 108/80 | HR 81 | Temp 98.6°F | Ht 67.0 in | Wt 172.0 lb

## 2017-04-07 DIAGNOSIS — E782 Mixed hyperlipidemia: Secondary | ICD-10-CM

## 2017-04-07 DIAGNOSIS — Z136 Encounter for screening for cardiovascular disorders: Secondary | ICD-10-CM

## 2017-04-07 DIAGNOSIS — Z13220 Encounter for screening for lipoid disorders: Secondary | ICD-10-CM | POA: Diagnosis not present

## 2017-04-07 DIAGNOSIS — Z0000 Encounter for general adult medical examination without abnormal findings: Secondary | ICD-10-CM

## 2017-04-07 LAB — COMPREHENSIVE METABOLIC PANEL
ALK PHOS: 56 U/L (ref 39–117)
ALT: 12 U/L (ref 0–35)
AST: 17 U/L (ref 0–37)
Albumin: 4.4 g/dL (ref 3.5–5.2)
BILIRUBIN TOTAL: 0.7 mg/dL (ref 0.2–1.2)
BUN: 9 mg/dL (ref 6–23)
CALCIUM: 9.6 mg/dL (ref 8.4–10.5)
CO2: 32 mEq/L (ref 19–32)
CREATININE: 0.89 mg/dL (ref 0.40–1.20)
Chloride: 103 mEq/L (ref 96–112)
GFR: 85.62 mL/min (ref >60.00)
GLUCOSE: 94 mg/dL (ref 70–99)
Potassium: 4 mEq/L (ref 3.5–5.1)
Sodium: 140 mEq/L (ref 135–145)
TOTAL PROTEIN: 7.7 g/dL (ref 6.0–8.3)

## 2017-04-07 LAB — LIPID PANEL
Cholesterol: 245 mg/dL — ABNORMAL HIGH (ref 0–200)
HDL: 46.5 mg/dL (ref >39.00)
LDL Cholesterol: 159 mg/dL — ABNORMAL HIGH (ref 0–99)
NonHDL: 198.49
TRIGLYCERIDES: 197 mg/dL — AB (ref 0.0–149.0)
Total CHOL/HDL Ratio: 5
VLDL: 39.4 mg/dL (ref 0.0–40.0)

## 2017-04-07 LAB — CBC
HCT: 39.3 % (ref 36.0–46.0)
Hemoglobin: 13.6 g/dL (ref 12.0–15.0)
MCHC: 34.8 g/dL (ref 30.0–36.0)
MCV: 83.6 fl (ref 78.0–100.0)
PLATELETS: 261 10*3/uL (ref 150.0–400.0)
RBC: 4.7 Mil/uL (ref 3.87–5.11)
RDW: 13.2 % (ref 11.5–15.5)
WBC: 6.7 10*3/uL (ref 4.0–10.5)

## 2017-04-07 LAB — TSH: TSH: 1.14 u[IU]/mL (ref 0.35–4.50)

## 2017-04-07 NOTE — Patient Instructions (Signed)
Preventive Care 40-64 Years, Female Preventive care refers to lifestyle choices and visits with your health care provider that can promote health and wellness. What does preventive care include?  A yearly physical exam. This is also called an annual well check.  Dental exams once or twice a year.  Routine eye exams. Ask your health care provider how often you should have your eyes checked.  Personal lifestyle choices, including: ? Daily care of your teeth and gums. ? Regular physical activity. ? Eating a healthy diet. ? Avoiding tobacco and drug use. ? Limiting alcohol use. ? Practicing safe sex. ? Taking low-dose aspirin daily starting at age 58. ? Taking vitamin and mineral supplements as recommended by your health care provider. What happens during an annual well check? The services and screenings done by your health care provider during your annual well check will depend on your age, overall health, lifestyle risk factors, and family history of disease. Counseling Your health care provider may ask you questions about your:  Alcohol use.  Tobacco use.  Drug use.  Emotional well-being.  Home and relationship well-being.  Sexual activity.  Eating habits.  Work and work Statistician.  Method of birth control.  Menstrual cycle.  Pregnancy history.  Screening You may have the following tests or measurements:  Height, weight, and BMI.  Blood pressure.  Lipid and cholesterol levels. These may be checked every 5 years, or more frequently if you are over 81 years old.  Skin check.  Lung cancer screening. You may have this screening every year starting at age 78 if you have a 30-pack-year history of smoking and currently smoke or have quit within the past 15 years.  Fecal occult blood test (FOBT) of the stool. You may have this test every year starting at age 65.  Flexible sigmoidoscopy or colonoscopy. You may have a sigmoidoscopy every 5 years or a colonoscopy  every 10 years starting at age 30.  Hepatitis C blood test.  Hepatitis B blood test.  Sexually transmitted disease (STD) testing.  Diabetes screening. This is done by checking your blood sugar (glucose) after you have not eaten for a while (fasting). You may have this done every 1-3 years.  Mammogram. This may be done every 1-2 years. Talk to your health care provider about when you should start having regular mammograms. This may depend on whether you have a family history of breast cancer.  BRCA-related cancer screening. This may be done if you have a family history of breast, ovarian, tubal, or peritoneal cancers.  Pelvic exam and Pap test. This may be done every 3 years starting at age 80. Starting at age 36, this may be done every 5 years if you have a Pap test in combination with an HPV test.  Bone density scan. This is done to screen for osteoporosis. You may have this scan if you are at high risk for osteoporosis.  Discuss your test results, treatment options, and if necessary, the need for more tests with your health care provider. Vaccines Your health care provider may recommend certain vaccines, such as:  Influenza vaccine. This is recommended every year.  Tetanus, diphtheria, and acellular pertussis (Tdap, Td) vaccine. You may need a Td booster every 10 years.  Varicella vaccine. You may need this if you have not been vaccinated.  Zoster vaccine. You may need this after age 5.  Measles, mumps, and rubella (MMR) vaccine. You may need at least one dose of MMR if you were born in  1957 or later. You may also need a second dose.  Pneumococcal 13-valent conjugate (PCV13) vaccine. You may need this if you have certain conditions and were not previously vaccinated.  Pneumococcal polysaccharide (PPSV23) vaccine. You may need one or two doses if you smoke cigarettes or if you have certain conditions.  Meningococcal vaccine. You may need this if you have certain  conditions.  Hepatitis A vaccine. You may need this if you have certain conditions or if you travel or work in places where you may be exposed to hepatitis A.  Hepatitis B vaccine. You may need this if you have certain conditions or if you travel or work in places where you may be exposed to hepatitis B.  Haemophilus influenzae type b (Hib) vaccine. You may need this if you have certain conditions.  Talk to your health care provider about which screenings and vaccines you need and how often you need them. This information is not intended to replace advice given to you by your health care provider. Make sure you discuss any questions you have with your health care provider. Document Released: 10/16/2015 Document Revised: 06/08/2016 Document Reviewed: 07/21/2015 Elsevier Interactive Patient Education  2017 [employer].

## 2017-04-07 NOTE — Progress Notes (Signed)
Subjective:    Patient ID: Sally Kidd, female    DOB: 1966/01/15, 51 y.o.   MRN: [id number]  Patient presents today for complete physical  HPI Chronic back pain: Managed by [place] [facility]. Prescribing tramadol and robaxin  Insomnia: Use of benadryl at bedtime.  Hot flashes: Use of paxil with some improvement.  Immunizations: (TDAP, Hep C screen, Pneumovax, Influenza, zoster)  Health Maintenance  Topic Date Due  . HIV Screening  04/02/2018*  . Flu Shot  05/03/2017  . Mammogram  04/27/2018  . Tetanus Vaccine  10/03/2022  . Colon Cancer Screening  04/14/2025  . Pap Smear  Excluded  *Topic was postponed. The date shown is not the original due date.   Diet:regular.  Weight:  Wt Readings from Last 3 Encounters:  04/07/17 172 lb (78 kg)  04/04/16 166 lb (75.3 kg)  04/15/15 147 lb (66.7 kg)   Exercise: none at this time, but will start swimming.  Fall Risk: Fall Risk  04/07/2017  Falls in the past year? No   Home Safety:home alone,   Depression/Suicide: Depression screen [facility] 2/9 04/07/2017  Decreased Interest 0  Down, Depressed, Hopeless 0  PHQ - 2 Score 0   Pap Smear (every 70yrs for >21-29 without HPV, every 25yrs for >30-29yrs with HPV):no hx of abnormal pap.  Vision:annually.  Dental:every 6months.  Sexual History (birth control, marital status, STD):single, not sexually active.  Medications and allergies reviewed with patient and updated if appropriate.  Patient Active Problem List   Diagnosis Date Noted  . Routine general medical examination at a health care facility 04/03/2015  . Back pain 10/14/2014  . Insomnia 10/14/2014    Current Outpatient Prescriptions on File Prior to Visit  Medication Sig Dispense Refill  . Biotin 7500 MCG TABS Take by mouth daily. Reported on 04/04/2016    . cetirizine (ZYRTEC) 10 MG tablet Take 20 mg by mouth as needed. Reported on 04/04/2016    . diphenhydrAMINE (BENADRYL) 25 MG tablet Take 25 mg by mouth every 6 (six) hours  as needed. Reported on 04/04/2016    . Misc Natural Products (COLON CLEANSER PO) Take 3 capsules by mouth once a week.    Marland Kitchen PARoxetine (PAXIL) 10 MG tablet TAKE 1 TABLET IN THE MORNING. 90 tablet 3  . traMADol (ULTRAM) 50 MG tablet Take 1 tablet (50 mg total) by mouth 2 (two) times daily. 10 tablet 0  . methocarbamol (ROBAXIN) 500 MG tablet Take 1,000 mg by mouth 4 (four) times daily. Reported on 04/04/2016     No current facility-administered medications on file prior to visit.     Past Medical History:  Diagnosis Date  . Back pain    back injury due to fall in 2013  . Constipation   . DDD (degenerative disc disease), lumbar   . Hemorrhoids     Past Surgical History:  Procedure Laterality Date  . ABDOMINAL HYSTERECTOMY    . BREAST BIOPSY     Bil/ benign    Social History   Social History  . Marital status: Married    Spouse name: N/A  . Number of children: N/A  . Years of education: N/A   Social History Main Topics  . Smoking status: Never Smoker  . Smokeless tobacco: Never Used  . Alcohol use 1.2 oz/week    2 Glasses of wine per week  . Drug use: No  . Sexual activity: Not Asked   Other Topics Concern  . None   Social History Narrative  .  None    Family History  Problem Relation Age of Onset  . Diabetes Mother   . Hyperlipidemia Mother   . Cancer Father        prostate  . Diabetes Sister   . Hyperlipidemia Sister   . Hypertension Sister   . Colon cancer Neg Hx         Review of Systems  Constitutional: Negative for fever, malaise/fatigue and weight loss.  HENT: Negative for congestion and sore throat.   Eyes:       Negative for visual changes  Respiratory: Negative for cough and shortness of breath.   Cardiovascular: Negative for chest pain, palpitations and leg swelling.  Gastrointestinal: Negative for blood in stool, constipation, diarrhea and heartburn.  Genitourinary: Negative for dysuria, frequency and urgency.  Musculoskeletal: Negative for  falls, joint pain and myalgias.  Skin: Negative for rash.  Neurological: Negative for dizziness, sensory change and headaches.  Endo/Heme/Allergies: Does not bruise/bleed easily.  Psychiatric/Behavioral: Negative for depression, substance abuse and suicidal ideas. The patient is not nervous/anxious.    Lipid Panel     Component Value Date/Time   CHOL 245 (H) 04/07/2017 1459   TRIG 197.0 (H) 04/07/2017 1459   HDL 46.50 04/07/2017 1459   CHOLHDL 5 04/07/2017 1459   VLDL 39.4 04/07/2017 1459   LDLCALC 159 (H) 04/07/2017 1459   CMP     Component Value Date/Time   NA 140 04/07/2017 1459   K 4.0 04/07/2017 1459   CL 103 04/07/2017 1459   CO2 32 04/07/2017 1459   GLUCOSE 94 04/07/2017 1459   BUN 9 04/07/2017 1459   CREATININE 0.89 04/07/2017 1459   CALCIUM 9.6 04/07/2017 1459   PROT 7.7 04/07/2017 1459   ALBUMIN 4.4 04/07/2017 1459   AST 17 04/07/2017 1459   ALT 12 04/07/2017 1459   ALKPHOS 56 04/07/2017 1459   BILITOT 0.7 04/07/2017 1459   CBC    Component Value Date/Time   WBC 6.7 04/07/2017 1459   RBC 4.70 04/07/2017 1459   HGB 13.6 04/07/2017 1459   HCT 39.3 04/07/2017 1459   PLT 261.0 04/07/2017 1459   MCV 83.6 04/07/2017 1459   MCHC 34.8 04/07/2017 1459   RDW 13.2 04/07/2017 1459   Objective:   Vitals:   04/07/17 1353  BP: 108/80  Pulse: 81  Temp: 98.6 F (37 C)    Body mass index is 26.94 kg/m.   Physical Examination:  Physical Exam  Constitutional: She is oriented to person, place, and time and well-developed, well-nourished, and in no distress. No distress.  HENT:  Right Ear: External ear normal.  Left Ear: External ear normal.  Nose: Nose normal.  Mouth/Throat: No oropharyngeal exudate.  Eyes: Conjunctivae and EOM are normal. Pupils are equal, round, and reactive to light. No scleral icterus.  Neck: Normal range of motion. Neck supple. No thyromegaly present.  Cardiovascular: Normal rate, regular rhythm, normal heart sounds and intact distal  pulses.   Pulmonary/Chest: Effort normal and breath sounds normal. Right breast exhibits no inverted nipple, no mass, no nipple discharge, no skin change and no tenderness. Left breast exhibits no inverted nipple, no mass, no nipple discharge, no skin change and no tenderness. Breasts are asymmetrical.  Left breast bigger than right breast (normal for patient).  upcoming mammogram next week.  Abdominal: Soft. Bowel sounds are normal. She exhibits no distension. There is no tenderness.  Genitourinary: Rectum normal and vulva normal. Cervix exhibits no motion tenderness.  Musculoskeletal: Normal range of motion. She  exhibits no edema or tenderness.  Lymphadenopathy:    She has no cervical adenopathy.  Neurological: She is alert and oriented to person, place, and time. Gait normal.  Skin: Skin is warm and dry.  Psychiatric: Affect and judgment normal.  Vitals reviewed.   ASSESSMENT and PLAN:  Cala BradfordKimberly was seen today for annual exam.  Diagnoses and all orders for this visit:  Preventative health care -     Lipid panel; Future -     Comprehensive metabolic panel; Future -     CBC; Future -     TSH; Future  Mixed hyperlipidemia -     Lipid panel; Future    No problem-specific Assessment & Plan notes found for this encounter.      Follow up: Return if symptoms worsen or fail to improve.  Alysia Pennaharlotte Nche, NP

## 2017-04-10 ENCOUNTER — Ambulatory Visit
Admission: RE | Admit: 2017-04-10 | Discharge: 2017-04-10 | Disposition: A | Discharge status: Home / Self Care | Payer: BC Managed Care – PPO | Source: Ambulatory Visit | Attending: Internal Medicine | Admitting: Internal Medicine

## 2017-04-10 ENCOUNTER — Telehealth: Payer: Self-pay | Admitting: Nurse Practitioner

## 2017-04-10 DIAGNOSIS — E782 Mixed hyperlipidemia: Secondary | ICD-10-CM

## 2017-04-10 DIAGNOSIS — Z1231 Encounter for screening mammogram for malignant neoplasm of breast: Secondary | ICD-10-CM

## 2017-04-10 MED ORDER — ATORVASTATIN CALCIUM 20 MG PO TABS: 20.0000 mg | ORAL_TABLET | Freq: Every day | ORAL | 1 refills | Status: DC

## 2017-04-10 NOTE — Telephone Encounter (Signed)
rx sent

## 2017-05-09 ENCOUNTER — Other Ambulatory Visit: Payer: Self-pay | Admitting: Internal Medicine

## 2017-08-07 ENCOUNTER — Other Ambulatory Visit: Payer: Self-pay | Admitting: Internal Medicine

## 2017-08-30 ENCOUNTER — Encounter (HOSPITAL_COMMUNITY): Payer: Self-pay | Admitting: Family Medicine

## 2017-08-30 ENCOUNTER — Ambulatory Visit (HOSPITAL_COMMUNITY)
Admission: EM | Admit: 2017-08-30 | Discharge: 2017-08-30 | Disposition: A | Discharge status: Home / Self Care | Payer: BC Managed Care – PPO | Attending: Family Medicine | Admitting: Family Medicine

## 2017-08-30 ENCOUNTER — Other Ambulatory Visit: Payer: Self-pay

## 2017-08-30 DIAGNOSIS — K130 Diseases of lips: Secondary | ICD-10-CM

## 2017-08-30 MED ORDER — HYDROCODONE-ACETAMINOPHEN 5-325 MG PO TABS: 1.0000 | ORAL_TABLET | Freq: Four times a day (QID) | ORAL | 0 refills | Status: DC | PRN

## 2017-08-30 NOTE — ED Triage Notes (Addendum)
Pt had oral surgery on Monday, something removed from her internal bottom lip.  [facility].  Pt states she was dismissed with no pain medication.  Pt started experiencing pain after she went home so she called the facility to get medication.  She was given a Rx for Tylenol #3, quantity 6.  This was filled on 11/26.  Pt took three pills on Monday, taking two at one time.  Her pain was not resolved. She took three pills on Tuesday.  Pt is here today because she does not want to return to the same physician who did her surgery.  She is very upset with them.  She expresses confusion about their practices and does not want to go back to them for her pain.  Pt admits to taking Tramadol that is prescribed to her. She also has tried taking two Aleve, with no relief.  Pt would like a new pain medication for pain today.

## 2017-08-30 NOTE — Discharge Instructions (Signed)
Please schedule follow up with your dentist next week.  Be aware, pain medications may cause drowsiness. Please do not drive, operate heavy machinery or make important decisions while on this medication, it can cloud your judgement.

## 2017-08-30 NOTE — ED Provider Notes (Signed)
  [facility]   [id number] 08/30/17 Arrival Time: 1155  ASSESSMENT & PLAN:  1. Lip pain   s/p oral surgery No s/s of infection  Meds ordered this encounter  Medications  . HYDROcodone-acetaminophen (NORCO/VICODIN) 5-325 MG tablet    Sig: Take 1 tablet by mouth every 6 (six) hours as needed for moderate pain or severe pain.    Dispense:  8 tablet    Refill:  0    [place] Controlled Substances Registry consulted for this patient. I feel the risk/benefit ratio today is favorable for proceeding with this prescription for a controlled substance. Medication sedation precautions given.  She will schedule f/u dental evaluation as soon as possible. May return here as needed.  Reviewed expectations re: course of current medical issues. Questions answered. Outlined signs and symptoms indicating need for more acute intervention. Patient verbalized understanding. After Visit Summary given.   SUBJECTIVE:  Sally Kidd is a 51 y.o. female who reports lower lip discomfort after having oral surgery on lower lip 2 days ago. "Removed place I had been biting regularly." Tylenol #3 given without relief of pain. Afebrile. No drainage from area. Tolerating normal PO intake. Otherwise well.  ROS: As per HPI.  OBJECTIVE:  Vitals:   08/30/17 1230  BP: 122/85  Temp: 98.5 F (36.9 C)  TempSrc: Oral  SpO2: 98%    General appearance: alert; no distress HENT: lower lip with sutures in place; no sign of infection; tender to palpation Neck: supple without LAD Lungs: normal respirations Skin: warm and dry Psychological: alert and cooperative; normal mood and affect  No Known Allergies  Past Medical History:  Diagnosis Date  . Back pain    back injury due to fall in 2013  . Constipation   . DDD (degenerative disc disease), lumbar   . Hemorrhoids    Social History   Socioeconomic History  . Marital status: Married    Spouse name: Not on file  . Number of children: Not on  file  . Years of education: Not on file  . Highest education level: Not on file  Social Needs  . Financial resource strain: Not on file  . Food insecurity - worry: Not on file  . Food insecurity - inability: Not on file  . Transportation needs - medical: Not on file  . Transportation needs - non-medical: Not on file  Occupational History  . Not on file  Tobacco Use  . Smoking status: Never Smoker  . Smokeless tobacco: Never Used  Substance and Sexual Activity  . Alcohol use: Yes    Alcohol/week: 1.2 oz    Types: 2 Glasses of wine per week  . Drug use: No  . Sexual activity: Not on file  Other Topics Concern  . Not on file  Social History Narrative  . Not on file   Family History  Problem Relation Age of Onset  . Diabetes Mother   . Hyperlipidemia Mother   . Cancer Father        prostate  . Diabetes Sister   . Hyperlipidemia Sister   . Hypertension Sister   . Colon cancer Neg Hx    Past Surgical History:  Procedure Laterality Date  . ABDOMINAL HYSTERECTOMY    . BREAST BIOPSY     Bil/ benign     Mardella LaymanHagler, Obert Espindola, MD 08/30/17 1243

## 2018-01-19 ENCOUNTER — Other Ambulatory Visit: Payer: Self-pay | Admitting: Nurse Practitioner

## 2018-01-19 DIAGNOSIS — E782 Mixed hyperlipidemia: Secondary | ICD-10-CM

## 2018-01-23 ENCOUNTER — Other Ambulatory Visit: Payer: Self-pay | Admitting: Internal Medicine

## 2018-01-23 DIAGNOSIS — E782 Mixed hyperlipidemia: Secondary | ICD-10-CM

## 2018-01-24 ENCOUNTER — Other Ambulatory Visit: Payer: Self-pay | Admitting: Nurse Practitioner

## 2018-01-24 DIAGNOSIS — E782 Mixed hyperlipidemia: Secondary | ICD-10-CM

## 2018-01-25 ENCOUNTER — Telehealth: Payer: Self-pay | Admitting: Internal Medicine

## 2018-01-25 NOTE — Telephone Encounter (Signed)
Copied from CRM [id number]. Topic: Quick Communication - Rx Refill/Question >> Jan 25, 2018  1:40 PM Alexander BergeronBarksdale, Sally B wrote: Medication: atorvastatin (LIPITOR) 20 MG tablet [[id number]][[id number]]  Has the patient contacted their pharmacy? Yes.   (Agent: If no, request that the patient contact the pharmacy for the refill.) Preferred Pharmacy (with phone number or street name): [facility] Agent: Please be advised that RX refills may take up to 3 business days. We ask that you follow-up with your pharmacy.

## 2018-01-26 NOTE — Telephone Encounter (Signed)
Already filled on 01/23/18 in another encounter.

## 2018-01-30 ENCOUNTER — Other Ambulatory Visit: Payer: Self-pay | Admitting: Nurse Practitioner

## 2018-01-30 DIAGNOSIS — E782 Mixed hyperlipidemia: Secondary | ICD-10-CM

## 2018-03-22 ENCOUNTER — Other Ambulatory Visit: Payer: Self-pay | Admitting: Internal Medicine

## 2018-03-26 ENCOUNTER — Other Ambulatory Visit: Payer: Self-pay | Admitting: Internal Medicine

## 2018-03-26 DIAGNOSIS — Z1231 Encounter for screening mammogram for malignant neoplasm of breast: Secondary | ICD-10-CM

## 2018-04-12 ENCOUNTER — Encounter: Payer: Self-pay | Admitting: Internal Medicine

## 2018-04-12 ENCOUNTER — Ambulatory Visit (INDEPENDENT_AMBULATORY_CARE_PROVIDER_SITE_OTHER): Payer: BC Managed Care – PPO | Admitting: Internal Medicine

## 2018-04-12 ENCOUNTER — Other Ambulatory Visit (INDEPENDENT_AMBULATORY_CARE_PROVIDER_SITE_OTHER): Payer: BC Managed Care – PPO

## 2018-04-12 VITALS — BP 100/60 | HR 82 | Temp 98.5°F | Ht 67.0 in | Wt 176.0 lb

## 2018-04-12 DIAGNOSIS — G4700 Insomnia, unspecified: Secondary | ICD-10-CM | POA: Diagnosis not present

## 2018-04-12 DIAGNOSIS — M545 Low back pain, unspecified: Secondary | ICD-10-CM

## 2018-04-12 DIAGNOSIS — Z0000 Encounter for general adult medical examination without abnormal findings: Secondary | ICD-10-CM

## 2018-04-12 DIAGNOSIS — G8929 Other chronic pain: Secondary | ICD-10-CM

## 2018-04-12 LAB — CBC
HEMATOCRIT: 37.8 % (ref 36.0–46.0)
Hemoglobin: 13.1 g/dL (ref 12.0–15.0)
MCHC: 34.7 g/dL (ref 30.0–36.0)
MCV: 83.4 fl (ref 78.0–100.0)
Platelets: 246 10*3/uL (ref 150.0–400.0)
RBC: 4.53 Mil/uL (ref 3.87–5.11)
RDW: 13 % (ref 11.5–15.5)
WBC: 7.1 10*3/uL (ref 4.0–10.5)

## 2018-04-12 LAB — COMPREHENSIVE METABOLIC PANEL
ALBUMIN: 4.4 g/dL (ref 3.5–5.2)
ALT: 16 U/L (ref 0–35)
AST: 22 U/L (ref 0–37)
Alkaline Phosphatase: 59 U/L (ref 39–117)
BUN: 6 mg/dL (ref 6–23)
CHLORIDE: 103 meq/L (ref 96–112)
CO2: 31 meq/L (ref 19–32)
CREATININE: 0.86 mg/dL (ref 0.40–1.20)
Calcium: 9.5 mg/dL (ref 8.4–10.5)
GFR: 88.73 mL/min (ref >60.00)
GLUCOSE: 103 mg/dL — AB (ref 70–99)
POTASSIUM: 4.1 meq/L (ref 3.5–5.1)
SODIUM: 140 meq/L (ref 135–145)
Total Bilirubin: 0.7 mg/dL (ref 0.2–1.2)
Total Protein: 7.5 g/dL (ref 6.0–8.3)

## 2018-04-12 LAB — VITAMIN B12: Vitamin B-12: 496 pg/mL (ref 211–911)

## 2018-04-12 LAB — VITAMIN D 25 HYDROXY (VIT D DEFICIENCY, FRACTURES): VITD: 12.48 ng/mL — AB (ref 30.00–100.00)

## 2018-04-12 LAB — LIPID PANEL
CHOL/HDL RATIO: 3
Cholesterol: 167 mg/dL (ref 0–200)
HDL: 49.9 mg/dL (ref >39.00)
LDL CALC: 86 mg/dL (ref 0–99)
NONHDL: 116.72
Triglycerides: 155 mg/dL — ABNORMAL HIGH (ref 0.0–149.0)
VLDL: 31 mg/dL (ref 0.0–40.0)

## 2018-04-12 LAB — TSH: TSH: 1.34 u[IU]/mL (ref 0.35–4.50)

## 2018-04-12 LAB — HEMOGLOBIN A1C: Hgb A1c MFr Bld: 5.9 % (ref 4.6–6.5)

## 2018-04-12 NOTE — Progress Notes (Signed)
   Subjective:    Patient ID: Rolanda LundborgKimberly Fryer, female    DOB: 11/19/1965, 52 y.o.   MRN: [id number]  HPI The patient is a 52 YO female coming in for physical. No new concerns.   Tramadol TID wants to get from us instead of orthopedics as co-pay is less and they are not changing therapy as well.   PM[facility], [facility], social history reviewed and updated.   Review of Systems  Constitutional: Negative.   HENT: Negative.   Eyes: Negative.   Respiratory: Negative for cough, chest tightness and shortness of breath.   Cardiovascular: Negative for chest pain, palpitations and leg swelling.  Gastrointestinal: Negative for abdominal distention, abdominal pain, constipation, diarrhea, nausea and vomiting.  Musculoskeletal: Positive for arthralgias.  Skin: Negative.   Neurological: Negative.   Psychiatric/Behavioral: Negative.       Objective:   Physical Exam  Constitutional: She is oriented to person, place, and time. She appears well-developed and well-nourished.  HENT:  Head: Normocephalic and atraumatic.  Eyes: EOM are normal.  Neck: Normal range of motion.  Cardiovascular: Normal rate and regular rhythm.  Pulmonary/Chest: Effort normal and breath sounds normal. No respiratory distress. She has no wheezes. She has no rales.  Abdominal: Soft. Bowel sounds are normal. She exhibits no distension. There is no tenderness. There is no rebound.  Musculoskeletal: She exhibits no edema.  Neurological: She is alert and oriented to person, place, and time. Coordination normal.  Skin: Skin is warm and dry.  Psychiatric: She has a normal mood and affect.   Vitals:   04/12/18 1310  BP: 100/60  Pulse: 82  Temp: 98.5 F (36.9 C)  TempSrc: Oral  SpO2: 97%  Weight: 176 lb (79.8 kg)  Height: 5\' 7"  (1.702 m)      Assessment & Plan:

## 2018-04-12 NOTE — Patient Instructions (Addendum)
We have added you to the shingles waiting list.   We have sent in the tramadol and will see you back in about 6 months.   We are checking the labs today.   Consider trying the fenotrex for the sleeping or zzquil or melatonin.   With vitamins the label that has a USP seal on it is independently inspected for safety and accuracy.   Health Maintenance, Female Adopting a healthy lifestyle and getting preventive care can go a long way to promote health and wellness. Talk with your health care provider about what schedule of regular examinations is right for you. This is a good chance for you to check in with your provider about disease prevention and staying healthy. In between checkups, there are plenty of things you can do on your own. Experts have done a lot of research about which lifestyle changes and preventive measures are most likely to keep you healthy. Ask your health care provider for more information. Weight and diet Eat a healthy diet  Be sure to include plenty of vegetables, fruits, low-fat dairy products, and lean protein.  Do not eat a lot of foods high in solid fats, added sugars, or salt.  Get regular exercise. This is one of the most important things you can do for your health. ? Most adults should exercise for at least 150 minutes each week. The exercise should increase your heart rate and make you sweat (moderate-intensity exercise). ? Most adults should also do strengthening exercises at least twice a week. This is in addition to the moderate-intensity exercise.  Maintain a healthy weight  Body mass index (BMI) is a measurement that can be used to identify possible weight problems. It estimates body fat based on height and weight. Your health care provider can help determine your BMI and help you achieve or maintain a healthy weight.  For females 65 years of age and older: ? A BMI below 18.5 is considered underweight. ? A BMI of 18.5 to 24.9 is normal. ? A BMI of 25 to  29.9 is considered overweight. ? A BMI of 30 and above is considered obese.  Watch levels of cholesterol and blood lipids  You should start having your blood tested for lipids and cholesterol at 53 years of age, then have this test every 5 years.  You may need to have your cholesterol levels checked more often if: ? Your lipid or cholesterol levels are high. ? You are older than 53 years of age. ? You are at high risk for heart disease.  Cancer screening Lung Cancer  Lung cancer screening is recommended for adults 54-8 years old who are at high risk for lung cancer because of a history of smoking.  A yearly low-dose CT scan of the lungs is recommended for people who: ? Currently smoke. ? Have quit within the past 15 years. ? Have at least a 30-pack-year history of smoking. A pack year is smoking an average of one pack of cigarettes a day for 1 year.  Yearly screening should continue until it has been 15 years since you quit.  Yearly screening should stop if you develop a health problem that would prevent you from having lung cancer treatment.  Breast Cancer  Practice breast self-awareness. This means understanding how your breasts normally appear and feel.  It also means doing regular breast self-exams. Let your health care provider know about any changes, no matter how small.  If you are in your 20s or 30s, you  should have a clinical breast exam (CBE) by a health care provider every 1-3 years as part of a regular health exam.  If you are 12 or older, have a CBE every year. Also consider having a breast X-ray (mammogram) every year.  If you have a family history of breast cancer, talk to your health care provider about genetic screening.  If you are at high risk for breast cancer, talk to your health care provider about having an MRI and a mammogram every year.  Breast cancer gene (BRCA) assessment is recommended for women who have family members with BRCA-related cancers.  BRCA-related cancers include: ? Breast. ? Ovarian. ? Tubal. ? Peritoneal cancers.  Results of the assessment will determine the need for genetic counseling and BRCA1 and BRCA2 testing.  Cervical Cancer Your health care provider may recommend that you be screened regularly for cancer of the pelvic organs (ovaries, uterus, and vagina). This screening involves a pelvic examination, including checking for microscopic changes to the surface of your cervix (Pap test). You may be encouraged to have this screening done every 3 years, beginning at age 66.  For women ages 65-65, health care providers may recommend pelvic exams and Pap testing every 3 years, or they may recommend the Pap and pelvic exam, combined with testing for human papilloma virus (HPV), every 5 years. Some types of HPV increase your risk of cervical cancer. Testing for HPV may also be done on women of any age with unclear Pap test results.  Other health care providers may not recommend any screening for nonpregnant women who are considered low risk for pelvic cancer and who do not have symptoms. Ask your health care provider if a screening pelvic exam is right for you.  If you have had past treatment for cervical cancer or a condition that could lead to cancer, you need Pap tests and screening for cancer for at least 20 years after your treatment. If Pap tests have been discontinued, your risk factors (such as having a new sexual partner) need to be reassessed to determine if screening should resume. Some women have medical problems that increase the chance of getting cervical cancer. In these cases, your health care provider may recommend more frequent screening and Pap tests.  Colorectal Cancer  This type of cancer can be detected and often prevented.  Routine colorectal cancer screening usually begins at 53 years of age and continues through 53 years of age.  Your health care provider may recommend screening at an earlier age if  you have risk factors for colon cancer.  Your health care provider may also recommend using home test kits to check for hidden blood in the stool.  A small camera at the end of a tube can be used to examine your colon directly (sigmoidoscopy or colonoscopy). This is done to check for the earliest forms of colorectal cancer.  Routine screening usually begins at age 43.  Direct examination of the colon should be repeated every 5-10 years through 53 years of age. However, you may need to be screened more often if early forms of precancerous polyps or small growths are found.  Skin Cancer  Check your skin from head to toe regularly.  Tell your health care provider about any new moles or changes in moles, especially if there is a change in a mole's shape or color.  Also tell your health care provider if you have a mole that is larger than the size of a pencil eraser.  Always use sunscreen. Apply sunscreen liberally and repeatedly throughout the day.  Protect yourself by wearing long sleeves, pants, a wide-brimmed hat, and sunglasses whenever you are outside.  Heart disease, diabetes, and high blood pressure  High blood pressure causes heart disease and increases the risk of stroke. High blood pressure is more likely to develop in: ? People who have blood pressure in the high end of the normal range (130-139/85-89 mm Hg). ? People who are overweight or obese. ? People who are African American.  If you are 75-54 years of age, have your blood pressure checked every 3-5 years. If you are 31 years of age or older, have your blood pressure checked every year. You should have your blood pressure measured twice-once when you are at a hospital or clinic, and once when you are not at a hospital or clinic. Record the average of the two measurements. To check your blood pressure when you are not at a hospital or clinic, you can use: ? An automated blood pressure machine at a pharmacy. ? A home blood  pressure monitor.  If you are between 76 years and 71 years old, ask your health care provider if you should take aspirin to prevent strokes.  Have regular diabetes screenings. This involves taking a blood sample to check your fasting blood sugar level. ? If you are at a normal weight and have a low risk for diabetes, have this test once every three years after 53 years of age. ? If you are overweight and have a high risk for diabetes, consider being tested at a younger age or more often. Preventing infection Hepatitis B  If you have a higher risk for hepatitis B, you should be screened for this virus. You are considered at high risk for hepatitis B if: ? You were born in a country where hepatitis B is common. Ask your health care provider which countries are considered high risk. ? Your parents were born in a high-risk country, and you have not been immunized against hepatitis B (hepatitis B vaccine). ? You have HIV or AIDS. ? You use needles to inject street drugs. ? You live with someone who has hepatitis B. ? You have had sex with someone who has hepatitis B. ? You get hemodialysis treatment. ? You take certain medicines for conditions, including cancer, organ transplantation, and autoimmune conditions.  Hepatitis C  Blood testing is recommended for: ? Everyone born from [birth year] through 1966. ? Anyone with known risk factors for hepatitis C.  Sexually transmitted infections (STIs)  You should be screened for sexually transmitted infections (STIs) including gonorrhea and chlamydia if: ? You are sexually active and are younger than 53 years of age. ? You are older than 53 years of age and your health care provider tells you that you are at risk for this type of infection. ? Your sexual activity has changed since you were last screened and you are at an increased risk for chlamydia or gonorrhea. Ask your health care provider if you are at risk.  If you do not have HIV, but are at risk,  it may be recommended that you take a prescription medicine daily to prevent HIV infection. This is called pre-exposure prophylaxis (PrEP). You are considered at risk if: ? You are sexually active and do not regularly use condoms or know the HIV status of your partner(s). ? You take drugs by injection. ? You are sexually active with a partner who has HIV.  Talk with  your health care provider about whether you are at high risk of being infected with HIV. If you choose to begin PrEP, you should first be tested for HIV. You should then be tested every 3 months for as long as you are taking PrEP. Pregnancy  If you are premenopausal and you may become pregnant, ask your health care provider about preconception counseling.  If you may become pregnant, take 400 to 800 micrograms (mcg) of folic acid every day.  If you want to prevent pregnancy, talk to your health care provider about birth control (contraception). Osteoporosis and menopause  Osteoporosis is a disease in which the bones lose minerals and strength with aging. This can result in serious bone fractures. Your risk for osteoporosis can be identified using a bone density scan.  If you are 30 years of age or older, or if you are at risk for osteoporosis and fractures, ask your health care provider if you should be screened.  Ask your health care provider whether you should take a calcium or vitamin D supplement to lower your risk for osteoporosis.  Menopause may have certain physical symptoms and risks.  Hormone replacement therapy may reduce some of these symptoms and risks. Talk to your health care provider about whether hormone replacement therapy is right for you. Follow these instructions at home:  Schedule regular health, dental, and eye exams.  Stay current with your immunizations.  Do not use any tobacco products including cigarettes, chewing tobacco, or electronic cigarettes.  If you are pregnant, do not drink  alcohol.  If you are breastfeeding, limit how much and how often you drink alcohol.  Limit alcohol intake to no more than 1 drink per day for nonpregnant women. One drink equals 12 ounces of beer, 5 ounces of wine, or 1 ounces of hard liquor.  Do not use street drugs.  Do not share needles.  Ask your health care provider for help if you need support or information about quitting drugs.  Tell your health care provider if you often feel depressed.  Tell your health care provider if you have ever been abused or do not feel safe at home. This information is not intended to replace advice given to you by your health care provider. Make sure you discuss any questions you have with your health care provider. Document Released: 04/04/2011 Document Revised: 02/25/2016 Document Reviewed: 06/23/2015 Elsevier Interactive Patient Education  [employer].

## 2018-04-13 LAB — PAIN MGMT, PROFILE 8 W/CONF, U
6 ACETYLMORPHINE: NEGATIVE ng/mL (ref <10)
Alcohol Metabolites: NEGATIVE ng/mL (ref <500)
Amphetamines: NEGATIVE ng/mL (ref <500)
Benzodiazepines: NEGATIVE ng/mL (ref <100)
Buprenorphine, Urine: NEGATIVE ng/mL (ref <5)
Cocaine Metabolite: NEGATIVE ng/mL (ref <150)
Creatinine: 96.9 mg/dL
MARIJUANA METABOLITE: NEGATIVE ng/mL (ref <20)
MDMA: NEGATIVE ng/mL (ref <500)
OPIATES: NEGATIVE ng/mL (ref <100)
OXYCODONE: NEGATIVE ng/mL (ref <100)
Oxidant: NEGATIVE ug/mL (ref <200)
pH: 7.18 (ref 4.5–9.0)

## 2018-04-13 MED ORDER — TRAMADOL HCL 50 MG PO TABS: 50.0000 mg | ORAL_TABLET | Freq: Three times a day (TID) | ORAL | 5 refills | Status: DC | PRN

## 2018-04-13 NOTE — Assessment & Plan Note (Signed)
Colonoscopy up to date, flu shot yearly. Tetanus up to date. Added to shingrix waiting list. Mammogram up to date. Pap smear not indicated.

## 2018-04-13 NOTE — Assessment & Plan Note (Signed)
Using paxil for night sweats.

## 2018-04-13 NOTE — Assessment & Plan Note (Signed)
Controlled substance contract signed. [place] narcotic database reviewed. UDS checked today. Taking tramadol TID and dosing stable. No early fills. ORT low risk.

## 2018-04-16 ENCOUNTER — Ambulatory Visit
Admission: RE | Admit: 2018-04-16 | Discharge: 2018-04-16 | Disposition: A | Discharge status: Home / Self Care | Payer: BC Managed Care – PPO | Source: Ambulatory Visit | Attending: Internal Medicine | Admitting: Internal Medicine

## 2018-04-16 DIAGNOSIS — Z1231 Encounter for screening mammogram for malignant neoplasm of breast: Secondary | ICD-10-CM

## 2018-04-17 ENCOUNTER — Other Ambulatory Visit: Payer: Self-pay | Admitting: Internal Medicine

## 2018-04-17 MED ORDER — VITAMIN D (ERGOCALCIFEROL) 1.25 MG (50000 UNIT) PO CAPS: 50000.0000 [IU] | ORAL_CAPSULE | ORAL | 0 refills | Status: DC

## 2018-05-09 ENCOUNTER — Ambulatory Visit: Payer: BC Managed Care – PPO

## 2018-05-11 ENCOUNTER — Telehealth: Payer: Self-pay | Admitting: Internal Medicine

## 2018-05-11 ENCOUNTER — Ambulatory Visit (INDEPENDENT_AMBULATORY_CARE_PROVIDER_SITE_OTHER): Payer: BC Managed Care – PPO

## 2018-05-11 DIAGNOSIS — Z299 Encounter for prophylactic measures, unspecified: Secondary | ICD-10-CM

## 2018-05-11 NOTE — Telephone Encounter (Signed)
Placed in MD folder to sign  

## 2018-05-11 NOTE — Telephone Encounter (Signed)
Pt dropped off [place] DMV application for handicap placard form for completion.  Form placed in Dr. Frutoso Chaserawford's box for pick up & completion by CMA.  Please call pt at [phone] once form is completed and she will come pick it up.

## 2018-05-14 NOTE — Telephone Encounter (Signed)
LVM informing patient that form is ready for pick up and is placed up front

## 2018-06-14 ENCOUNTER — Other Ambulatory Visit: Payer: Self-pay | Admitting: Internal Medicine

## 2018-07-18 ENCOUNTER — Other Ambulatory Visit: Payer: Self-pay | Admitting: Internal Medicine

## 2018-07-18 NOTE — Telephone Encounter (Signed)
Control database checked last refill: 06/21/2018 LOV: 04/12/2018 NOV: 09/24/2018

## 2018-07-18 NOTE — Telephone Encounter (Signed)
Copied from CRM [id number]. Topic: Quick Communication - Rx Refill/Question >> Jul 18, 2018 10:32 AM Darletta Moll L wrote: Medication: traMADol (ULTRAM) 50 MG tablet (has taken a little more than usual because pain increased from missing some therapy, has been mixing with aleve and has one pill left, pharmacy holding till the 10/19)  Has the patient contacted their pharmacy? Yes.   (Agent: If no, request that the patient contact the pharmacy for the refill.) (Agent: If yes, when and what did the pharmacy advise?)  Preferred Pharmacy (with phone number or street name): [facility] - [place], [place] - [address] [place] [address] Phone: [phone] Fax: [phone]  Agent: Please be advised that RX refills may take up to 3 business days. We ask that you follow-up with your pharmacy.

## 2018-07-18 NOTE — Telephone Encounter (Signed)
Patient says the pharmacy contacted Okey Dupre about this already and has not heard back. Please advise.

## 2018-07-18 NOTE — Telephone Encounter (Signed)
Please advise on early refill

## 2018-07-18 NOTE — Telephone Encounter (Signed)
Patient already has refills, should not refill early and should not take outside of prescription.

## 2018-08-13 ENCOUNTER — Ambulatory Visit (INDEPENDENT_AMBULATORY_CARE_PROVIDER_SITE_OTHER): Payer: BC Managed Care – PPO

## 2018-08-13 DIAGNOSIS — Z23 Encounter for immunization: Secondary | ICD-10-CM | POA: Diagnosis not present

## 2018-08-13 DIAGNOSIS — Z299 Encounter for prophylactic measures, unspecified: Secondary | ICD-10-CM

## 2018-09-24 ENCOUNTER — Ambulatory Visit: Payer: BC Managed Care – PPO | Admitting: Internal Medicine

## 2018-09-24 ENCOUNTER — Encounter: Payer: Self-pay | Admitting: Internal Medicine

## 2018-09-24 VITALS — BP 110/80 | HR 95 | Temp 99.0°F | Ht 67.0 in | Wt 175.0 lb

## 2018-09-24 DIAGNOSIS — E782 Mixed hyperlipidemia: Secondary | ICD-10-CM

## 2018-09-24 DIAGNOSIS — L729 Follicular cyst of the skin and subcutaneous tissue, unspecified: Secondary | ICD-10-CM

## 2018-09-24 MED ORDER — ATORVASTATIN CALCIUM 20 MG PO TABS: 20.0000 mg | ORAL_TABLET | Freq: Every day | ORAL | 3 refills | Status: DC

## 2018-09-24 MED ORDER — TRAMADOL HCL 50 MG PO TABS: 50.0000 mg | ORAL_TABLET | Freq: Three times a day (TID) | ORAL | 5 refills | Status: DC | PRN

## 2018-09-24 MED ORDER — PAROXETINE HCL 10 MG PO TABS: 10.0000 mg | ORAL_TABLET | Freq: Every morning | ORAL | 3 refills | Status: DC

## 2018-09-24 NOTE — Progress Notes (Signed)
   Subjective:   Patient ID: Sally Kidd, female    DOB: 09/18/1966, 52 y.o.   MRN: [id number]  HPI The patient is a 52 YO female coming in for a spot on her back. This was found by massage therapist and is mildly tender. She was getting a full body massage at the time. She does have chronic back pain and has gotten massages for pain many times. She denies swelling in the area. Denies drainage. Cannot see it herself or feel it well.   Review of Systems  Constitutional: Negative.   Respiratory: Negative for cough, chest tightness and shortness of breath.   Cardiovascular: Negative for chest pain, palpitations and leg swelling.  Gastrointestinal: Negative for abdominal distention, abdominal pain, constipation, diarrhea, nausea and vomiting.  Musculoskeletal: Negative.   Skin: Negative.        cyst  Neurological: Negative.   Psychiatric/Behavioral: Negative.     Objective:  Physical Exam Constitutional:      Appearance: She is well-developed.  HENT:     Head: Normocephalic and atraumatic.  Neck:     Musculoskeletal: Normal range of motion.  Cardiovascular:     Rate and Rhythm: Normal rate and regular rhythm.  Pulmonary:     Effort: Pulmonary effort is normal. No respiratory distress.     Breath sounds: Normal breath sounds. No wheezing or rales.  Abdominal:     General: Bowel sounds are normal. There is no distension.     Palpations: Abdomen is soft.     Tenderness: There is no abdominal tenderness. There is no rebound.  Musculoskeletal:     Comments: Small cyst on back  Skin:    General: Skin is warm and dry.  Neurological:     Mental Status: She is alert and oriented to person, place, and time.     Coordination: Coordination normal.     Vitals:   09/24/18 1332  BP: 110/80  Pulse: 95  Temp: 99 F (37.2 C)  TempSrc: Oral  SpO2: 97%  Weight: 175 lb (79.4 kg)  Height: 5\' 7"  (1.702 m)    Assessment & Plan:

## 2018-09-24 NOTE — Patient Instructions (Signed)
That spot is not cancerous.   We have refilled medications today.

## 2018-09-25 DIAGNOSIS — L729 Follicular cyst of the skin and subcutaneous tissue, unspecified: Secondary | ICD-10-CM | POA: Insufficient documentation

## 2018-09-25 NOTE — Assessment & Plan Note (Signed)
No signs of infection on exam, benign and discussed with patient.

## 2018-10-15 ENCOUNTER — Telehealth: Payer: Self-pay | Admitting: Internal Medicine

## 2018-10-15 NOTE — Telephone Encounter (Signed)
Okay to fill early?

## 2018-10-15 NOTE — Telephone Encounter (Signed)
Pharmacy informed of MD response.

## 2018-10-15 NOTE — Telephone Encounter (Signed)
Copied from CRM [id number]. Topic: Quick Communication - Rx Refill/Question >> Oct 15, 2018 10:39 AM Gloriann Loan L wrote: Medication: traMADol (ULTRAM) 50 MG tablet Pt only have two days left she states that she need it done by the 15th the pharmacy told her they couldn't refill until the 18th   Has the patient contacted their pharmacy? Yes.   (Agent: If no, request that the patient contact the pharmacy for the refill.) (Agent: If yes, when and what did the pharmacy advise?) spoke with them on Saturday told her they couldn't refill until the 18th   Preferred Pharmacy (with phone number or street name):     [facility] - [place], [place] - [address] [phone] (Phone) [phone] (Fax)    Agent: Please be advised that RX refills may take up to 3 business days. We ask that you follow-up with your pharmacy.

## 2019-03-11 ENCOUNTER — Other Ambulatory Visit: Payer: Self-pay | Admitting: Internal Medicine

## 2019-03-11 DIAGNOSIS — Z1231 Encounter for screening mammogram for malignant neoplasm of breast: Secondary | ICD-10-CM

## 2019-03-25 ENCOUNTER — Telehealth: Payer: Self-pay

## 2019-03-25 ENCOUNTER — Other Ambulatory Visit: Payer: Self-pay | Admitting: Internal Medicine

## 2019-03-25 NOTE — Telephone Encounter (Signed)
Should be fine to fill today, last filled 02/22/19 and this is 1 month supply.

## 2019-03-25 NOTE — Telephone Encounter (Signed)
Medication Refill - Medication: ultram  Has the patient contacted their pharmacy? Yes.   (Agent: If no, request that the patient contact the pharmacy for the refill.) (Agent: If yes, when and what did the pharmacy advise?)  Preferred Pharmacy (with phone number or street name): [facility]  Pharmacist did not realize that pt original rx was expired and needs new rx   Agent: Please be advised that RX refills may take up to 3 business days. We ask that you follow-up with your pharmacy.

## 2019-03-25 NOTE — Telephone Encounter (Signed)
Control database checked last refill:02/22/2019 LOV: 04/12/2018 cpe NOV: none

## 2019-03-25 NOTE — Telephone Encounter (Signed)
Pharmacy informed can refill

## 2019-03-25 NOTE — Telephone Encounter (Signed)
Copied from [place] [id number]. Topic: General - Inquiry >> Mar 25, 2019  2:44 PM Richardo Priest, NT wrote: Reason for CRM: Patient is calling in wanting her traMADol (ULTRAM) 50 MG tablet, to be filled a day early. States she cannot wait until tomorrow when it is due and will not make it through the night in extreme pain. Call back is [phone].

## 2019-03-28 ENCOUNTER — Encounter: Payer: Self-pay | Admitting: Internal Medicine

## 2019-03-28 ENCOUNTER — Ambulatory Visit (INDEPENDENT_AMBULATORY_CARE_PROVIDER_SITE_OTHER): Payer: BC Managed Care – PPO | Admitting: Internal Medicine

## 2019-03-28 ENCOUNTER — Other Ambulatory Visit: Payer: Self-pay

## 2019-03-28 VITALS — BP 126/78 | HR 82 | Temp 98.3°F | Ht 67.0 in | Wt 175.6 lb

## 2019-03-28 DIAGNOSIS — G4700 Insomnia, unspecified: Secondary | ICD-10-CM

## 2019-03-28 DIAGNOSIS — M545 Low back pain: Secondary | ICD-10-CM

## 2019-03-28 DIAGNOSIS — E782 Mixed hyperlipidemia: Secondary | ICD-10-CM | POA: Diagnosis not present

## 2019-03-28 DIAGNOSIS — G8929 Other chronic pain: Secondary | ICD-10-CM

## 2019-03-28 NOTE — Progress Notes (Signed)
   Subjective:   Patient ID: Sally Kidd, female    DOB: 1966-05-30, 53 y.o.   MRN: [id number]  HPI The patient is a 53 YO female coming in for follow up of her chronic back pain (taking tramadol mostly 3 per day, we did increase to 100 per month several months ago but her pharmacy has not been letting her fill monthly they are making her wait 34 days in between, she has been running out a couple days early and this is making her stressed, is using ibuprofen once she runs out but this does not work as well and is hurting her stomach) and insomnia (taking benadryl 2 at night time for sleep, this allows her to sleep mostly, tried [place] before and did not like how this made her feel, is slightly worse with the pandemic but she is working on Radiographer, therapeutic), and cholesterol (taking lipitor 20 mg daily, denies side effects, denies stroke or chest pain symptoms).   Review of Systems  Constitutional: Negative.   HENT: Negative.   Eyes: Negative.   Respiratory: Negative for cough, chest tightness and shortness of breath.   Cardiovascular: Negative for chest pain, palpitations and leg swelling.  Gastrointestinal: Negative for abdominal distention, abdominal pain, constipation, diarrhea, nausea and vomiting.  Musculoskeletal: Positive for back pain.  Skin: Negative.   Neurological: Negative.   Psychiatric/Behavioral: Positive for sleep disturbance.    Objective:  Physical Exam Constitutional:      Appearance: She is well-developed.  HENT:     Head: Normocephalic and atraumatic.  Neck:     Musculoskeletal: Normal range of motion.  Cardiovascular:     Rate and Rhythm: Normal rate and regular rhythm.  Pulmonary:     Effort: Pulmonary effort is normal. No respiratory distress.     Breath sounds: Normal breath sounds. No wheezing or rales.  Abdominal:     General: Bowel sounds are normal. There is no distension.     Palpations: Abdomen is soft.     Tenderness: There is no abdominal  tenderness. There is no rebound.  Musculoskeletal:        General: Tenderness present.  Skin:    General: Skin is warm and dry.  Neurological:     Mental Status: She is alert and oriented to person, place, and time.     Coordination: Coordination normal.     Vitals:   03/28/19 1320  BP: 126/78  Pulse: 82  Temp: 98.3 F (36.8 C)  TempSrc: Oral  SpO2: 98%  Weight: 175 lb 9.6 oz (79.7 kg)  Height: 5\' 7"  (1.702 m)    Assessment & Plan:

## 2019-03-29 DIAGNOSIS — E785 Hyperlipidemia, unspecified: Secondary | ICD-10-CM | POA: Insufficient documentation

## 2019-03-29 NOTE — Assessment & Plan Note (Signed)
Taking benadryl for sleep and this is still working well. Reassurance given that this is a safe sleep medication. Counseled on other relaxation techniques to help assist with sleep also.

## 2019-03-29 NOTE — Assessment & Plan Note (Signed)
Taking lipitor 20 mg daily and will check lipid panel at next visit as this is not quite due yet. No side effects of therapy.

## 2019-03-29 NOTE — Assessment & Plan Note (Addendum)
Script was adjusted at last fill to accommodate 100 pills per month and reminded her today that this is to last 1 month and if she continues to have increasing pain that she may have to return to back specialist for alternative treatments. [place] narcotic database reviewed and no inappropriate fills or early fills.

## 2019-04-15 ENCOUNTER — Encounter: Payer: BC Managed Care – PPO | Admitting: Internal Medicine

## 2019-04-23 ENCOUNTER — Ambulatory Visit: Payer: BC Managed Care – PPO | Admitting: Family Medicine

## 2019-04-30 ENCOUNTER — Ambulatory Visit
Admission: RE | Admit: 2019-04-30 | Discharge: 2019-04-30 | Disposition: A | Discharge status: Home / Self Care | Payer: BC Managed Care – PPO | Source: Ambulatory Visit | Attending: Internal Medicine | Admitting: Internal Medicine

## 2019-04-30 ENCOUNTER — Other Ambulatory Visit: Payer: Self-pay

## 2019-04-30 DIAGNOSIS — Z1231 Encounter for screening mammogram for malignant neoplasm of breast: Secondary | ICD-10-CM

## 2019-05-27 ENCOUNTER — Telehealth: Payer: Self-pay

## 2019-05-27 NOTE — Telephone Encounter (Signed)
Patient can wait till her next physical in December to get her labs done she will not be too much over due for labs. Last labs done in July of last year, that way we keep her from coming in multiple times and keep covid risk lower.

## 2019-05-27 NOTE — Telephone Encounter (Signed)
Copied from [place] [id number]. Topic: General - Other >> May 23, 2019  3:17 PM Keene Breath wrote: Reason for CRM: Patient called to ask if she can get orders for her lab work.  Patient stated that at her last physical she did not have any lab work done.  Please advise and let her know when she can schedule the appt.  CB# [phone]

## 2019-05-28 NOTE — Telephone Encounter (Signed)
Informed patient, expressed understanding.

## 2019-06-14 ENCOUNTER — Other Ambulatory Visit: Payer: Self-pay | Admitting: Internal Medicine

## 2019-06-14 NOTE — Telephone Encounter (Signed)
Patient calling to check status of this RX. Patient is out of this medication.

## 2019-06-14 NOTE — Telephone Encounter (Signed)
Done erx 

## 2019-07-12 ENCOUNTER — Other Ambulatory Visit: Payer: Self-pay | Admitting: Internal Medicine

## 2019-07-15 ENCOUNTER — Telehealth: Payer: Self-pay

## 2019-07-15 NOTE — Telephone Encounter (Signed)
Patient informed that the last refill was sent by another provider that is in our office because [facility] was not in the office that day and they usually do not do refills. Patient stated understanding

## 2019-07-15 NOTE — Telephone Encounter (Signed)
Control database checked last refill: 06/14/2019 100 tabs LOV: 03/28/2019 NOV: 09/24/2019

## 2019-07-15 NOTE — Telephone Encounter (Signed)
This was just sent in today with refills. I am not sure what this question is regarding?

## 2019-07-15 NOTE — Telephone Encounter (Signed)
Copied from [place] [phone]. Topic: General - Other >> Jul 15, 2019 10:50 AM Carolyn Stare wrote: Pt is wondering why there are no refills on her medication since it is a continuing med >> Jul 15, 2019 10:53 AM Carolyn Stare wrote:   Med is traMADol (ULTRAM) 50 MG tablet

## 2019-09-24 ENCOUNTER — Ambulatory Visit (INDEPENDENT_AMBULATORY_CARE_PROVIDER_SITE_OTHER): Payer: BC Managed Care – PPO | Admitting: Internal Medicine

## 2019-09-24 ENCOUNTER — Other Ambulatory Visit: Payer: Self-pay

## 2019-09-24 ENCOUNTER — Encounter: Payer: Self-pay | Admitting: Internal Medicine

## 2019-09-24 VITALS — BP 116/78 | HR 99 | Temp 98.1°F | Ht 67.0 in | Wt 178.0 lb

## 2019-09-24 DIAGNOSIS — M545 Low back pain, unspecified: Secondary | ICD-10-CM

## 2019-09-24 DIAGNOSIS — G8929 Other chronic pain: Secondary | ICD-10-CM | POA: Diagnosis not present

## 2019-09-24 DIAGNOSIS — E782 Mixed hyperlipidemia: Secondary | ICD-10-CM | POA: Diagnosis not present

## 2019-09-24 DIAGNOSIS — Z0000 Encounter for general adult medical examination without abnormal findings: Secondary | ICD-10-CM

## 2019-09-24 LAB — COMPREHENSIVE METABOLIC PANEL
ALT: 24 U/L (ref 0–35)
AST: 23 U/L (ref 0–37)
Albumin: 4.6 g/dL (ref 3.5–5.2)
Alkaline Phosphatase: 64 U/L (ref 39–117)
BUN: 12 mg/dL (ref 6–23)
CO2: 30 mEq/L (ref 19–32)
Calcium: 9.7 mg/dL (ref 8.4–10.5)
Chloride: 103 mEq/L (ref 96–112)
Creatinine, Ser: 0.9 mg/dL (ref 0.40–1.20)
GFR: 78.79 mL/min (ref >60.00)
Glucose, Bld: 97 mg/dL (ref 70–99)
Potassium: 3.7 mEq/L (ref 3.5–5.1)
Sodium: 140 mEq/L (ref 135–145)
Total Bilirubin: 0.4 mg/dL (ref 0.2–1.2)
Total Protein: 7.7 g/dL (ref 6.0–8.3)

## 2019-09-24 LAB — HEMOGLOBIN A1C: Hgb A1c MFr Bld: 5.8 % (ref 4.6–6.5)

## 2019-09-24 LAB — LIPID PANEL
Cholesterol: 195 mg/dL (ref 0–200)
HDL: 62.9 mg/dL (ref >39.00)
LDL Cholesterol: 105 mg/dL — ABNORMAL HIGH (ref 0–99)
NonHDL: 131.97
Total CHOL/HDL Ratio: 3
Triglycerides: 133 mg/dL (ref 0.0–149.0)
VLDL: 26.6 mg/dL (ref 0.0–40.0)

## 2019-09-24 LAB — CBC
HCT: 39 % (ref 36.0–46.0)
Hemoglobin: 13.2 g/dL (ref 12.0–15.0)
MCHC: 33.9 g/dL (ref 30.0–36.0)
MCV: 84.6 fl (ref 78.0–100.0)
Platelets: 271 10*3/uL (ref 150.0–400.0)
RBC: 4.61 Mil/uL (ref 3.87–5.11)
RDW: 13.1 % (ref 11.5–15.5)
WBC: 10.1 10*3/uL (ref 4.0–10.5)

## 2019-09-24 LAB — VITAMIN D 25 HYDROXY (VIT D DEFICIENCY, FRACTURES): VITD: 22.51 ng/mL — ABNORMAL LOW (ref 30.00–100.00)

## 2019-09-24 MED ORDER — PAROXETINE HCL 10 MG PO TABS: 10.0000 mg | ORAL_TABLET | Freq: Every morning | ORAL | 3 refills | Status: DC

## 2019-09-24 MED ORDER — ATORVASTATIN CALCIUM 20 MG PO TABS: 20.0000 mg | ORAL_TABLET | Freq: Every day | ORAL | 3 refills | Status: DC

## 2019-09-24 NOTE — Patient Instructions (Signed)
Health Maintenance, Female Adopting a healthy lifestyle and getting preventive care are important in promoting health and wellness. Ask your health care provider about:  The right schedule for you to have regular tests and exams.  Things you can do on your own to prevent diseases and keep yourself healthy. What should I know about diet, weight, and exercise? Eat a healthy diet   Eat a diet that includes plenty of vegetables, fruits, low-fat dairy products, and lean protein.  Do not eat a lot of foods that are high in solid fats, added sugars, or sodium. Maintain a healthy weight Body mass index (BMI) is used to identify weight problems. It estimates body fat based on height and weight. Your health care provider can help determine your BMI and help you achieve or maintain a healthy weight. Get regular exercise Get regular exercise. This is one of the most important things you can do for your health. Most adults should:  Exercise for at least 150 minutes each week. The exercise should increase your heart rate and make you sweat (moderate-intensity exercise).  Do strengthening exercises at least twice a week. This is in addition to the moderate-intensity exercise.  Spend less time sitting. Even light physical activity can be beneficial. Watch cholesterol and blood lipids Have your blood tested for lipids and cholesterol at 54 years of age, then have this test every 5 years. Have your cholesterol levels checked more often if:  Your lipid or cholesterol levels are high.  You are older than 54 years of age.  You are at high risk for heart disease. What should I know about cancer screening? Depending on your health history and family history, you may need to have cancer screening at various ages. This may include screening for:  Breast cancer.  Cervical cancer.  Colorectal cancer.  Skin cancer.  Lung cancer. What should I know about heart disease, diabetes, and high blood  pressure? Blood pressure and heart disease  High blood pressure causes heart disease and increases the risk of stroke. This is more likely to develop in people who have high blood pressure readings, are of African descent, or are overweight.  Have your blood pressure checked: ? Every 3-5 years if you are 18-39 years of age. ? Every year if you are 40 years old or older. Diabetes Have regular diabetes screenings. This checks your fasting blood sugar level. Have the screening done:  Once every three years after age 40 if you are at a normal weight and have a low risk for diabetes.  More often and at a younger age if you are overweight or have a high risk for diabetes. What should I know about preventing infection? Hepatitis B If you have a higher risk for hepatitis B, you should be screened for this virus. Talk with your health care provider to find out if you are at risk for hepatitis B infection. Hepatitis C Testing is recommended for:  Everyone born from 1945 through 1965.  Anyone with known risk factors for hepatitis C. Sexually transmitted infections (STIs)  Get screened for STIs, including gonorrhea and chlamydia, if: ? You are sexually active and are younger than 54 years of age. ? You are older than 54 years of age and your health care provider tells you that you are at risk for this type of infection. ? Your sexual activity has changed since you were last screened, and you are at increased risk for chlamydia or gonorrhea. Ask your health care provider if   you are at risk.  Ask your health care provider about whether you are at high risk for HIV. Your health care provider may recommend a prescription medicine to help prevent HIV infection. If you choose to take medicine to prevent HIV, you should first get tested for HIV. You should then be tested every 3 months for as long as you are taking the medicine. Pregnancy  If you are about to stop having your period (premenopausal) and  you may become pregnant, seek counseling before you get pregnant.  Take 400 to 800 micrograms (mcg) of folic acid every day if you become pregnant.  Ask for birth control (contraception) if you want to prevent pregnancy. Osteoporosis and menopause Osteoporosis is a disease in which the bones lose minerals and strength with aging. This can result in bone fractures. If you are 65 years old or older, or if you are at risk for osteoporosis and fractures, ask your health care provider if you should:  Be screened for bone loss.  Take a calcium or vitamin D supplement to lower your risk of fractures.  Be given hormone replacement therapy (HRT) to treat symptoms of menopause. Follow these instructions at home: Lifestyle  Do not use any products that contain nicotine or tobacco, such as cigarettes, e-cigarettes, and chewing tobacco. If you need help quitting, ask your health care provider.  Do not use street drugs.  Do not share needles.  Ask your health care provider for help if you need support or information about quitting drugs. Alcohol use  Do not drink alcohol if: ? Your health care provider tells you not to drink. ? You are pregnant, may be pregnant, or are planning to become pregnant.  If you drink alcohol: ? Limit how much you use to 0-1 drink a day. ? Limit intake if you are breastfeeding.  Be aware of how much alcohol is in your drink. In the [place] one drink equals one 12 oz bottle of beer (355 mL), one 5 oz glass of wine (148 mL), or one 1 oz glass of hard liquor (44 mL). General instructions  Schedule regular health, dental, and eye exams.  Stay current with your vaccines.  Tell your health care provider if: ? You often feel depressed. ? You have ever been abused or do not feel safe at home. Summary  Adopting a healthy lifestyle and getting preventive care are important in promoting health and wellness.  Follow your health care provider's instructions about healthy  diet, exercising, and getting tested or screened for diseases.  Follow your health care provider's instructions on monitoring your cholesterol and blood pressure. This information is not intended to replace advice given to you by your health care provider. Make sure you discuss any questions you have with your health care provider. Document Released: 04/04/2011 Document Revised: 09/12/2018 Document Reviewed: 09/12/2018 Elsevier Patient Education  2020 Elsevier Inc.  

## 2019-09-24 NOTE — Progress Notes (Signed)
   Subjective:   Patient ID: Sally Kidd, female    DOB: 1965/12/07, 53 y.o.   MRN: [id number]  HPI The patient is a 53 YO female coming in for physical.   PMH, [facility], social history reviewed and updated.   Review of Systems  Constitutional: Negative.   HENT: Negative.   Eyes: Negative.   Respiratory: Negative for cough, chest tightness and shortness of breath.   Cardiovascular: Negative for chest pain, palpitations and leg swelling.  Gastrointestinal: Negative for abdominal distention, abdominal pain, constipation, diarrhea, nausea and vomiting.  Musculoskeletal: Positive for arthralgias and back pain.  Skin: Negative.   Neurological: Negative.   Psychiatric/Behavioral: Negative.     Objective:  Physical Exam Constitutional:      Appearance: She is well-developed.  HENT:     Head: Normocephalic and atraumatic.  Cardiovascular:     Rate and Rhythm: Normal rate and regular rhythm.  Pulmonary:     Effort: Pulmonary effort is normal. No respiratory distress.     Breath sounds: Normal breath sounds. No wheezing or rales.  Abdominal:     General: Bowel sounds are normal. There is no distension.     Palpations: Abdomen is soft.     Tenderness: There is no abdominal tenderness. There is no rebound.  Musculoskeletal:     Cervical back: Normal range of motion.  Skin:    General: Skin is warm and dry.  Neurological:     Mental Status: She is alert and oriented to person, place, and time.     Coordination: Coordination normal.     Vitals:   09/24/19 1553  BP: 116/78  Pulse: 99  Temp: 98.1 F (36.7 C)  TempSrc: Oral  SpO2: 97%  Weight: 178 lb (80.7 kg)  Height: 5\' 7"  (1.702 m)    This visit occurred during the SARS-CoV-2 public health emergency.  Safety protocols were in place, including screening questions prior to the visit, additional usage of staff PPE, and extensive cleaning of exam room while observing appropriate contact time as indicated for disinfecting  solutions.   Assessment & Plan:

## 2019-09-25 MED ORDER — ATORVASTATIN CALCIUM 20 MG PO TABS: 20.0000 mg | ORAL_TABLET | Freq: Every day | ORAL | 3 refills | Status: DC

## 2019-09-25 MED ORDER — TRAMADOL HCL 50 MG PO TABS: ORAL_TABLET | ORAL | 5 refills | Status: DC

## 2019-09-25 NOTE — Assessment & Plan Note (Signed)
Uses tramadol #100 per month. [place] narcotic database reviewed and no inappropriate fills. Refilled today for 6 months and see her back in 6 months.

## 2019-09-25 NOTE — Assessment & Plan Note (Signed)
Flu shot up to date. Shingrix complete. Tetanus up to date. Colonoscopy up to date. Mammogram up to date, pap smear up to date. Counseled about sun safety and mole surveillance. Counseled about the dangers of distracted driving. Given 10 year screening recommendations.   

## 2019-09-25 NOTE — Assessment & Plan Note (Signed)
Taking lipitor 20 mg daily, checking lipid panel and adjust as needed.  °

## 2019-11-22 ENCOUNTER — Telehealth: Payer: Self-pay | Admitting: Internal Medicine

## 2019-11-22 NOTE — Telephone Encounter (Signed)
    Patient calling to request a letter to use while traveling, stating she has an emotional support animal (dog) that must be with her.  Please advise

## 2019-11-26 NOTE — Telephone Encounter (Signed)
Fine with that

## 2019-11-27 NOTE — Telephone Encounter (Signed)
Letter has been mailed.

## 2019-11-27 NOTE — Telephone Encounter (Signed)
Printed and signed please mail to patient.

## 2019-11-27 NOTE — Telephone Encounter (Signed)
Will you review the letter and adjust as needed.

## 2019-12-16 ENCOUNTER — Encounter: Payer: Self-pay | Admitting: Internal Medicine

## 2019-12-16 ENCOUNTER — Other Ambulatory Visit: Payer: Self-pay

## 2019-12-16 ENCOUNTER — Ambulatory Visit: Payer: BC Managed Care – PPO | Admitting: Internal Medicine

## 2019-12-16 VITALS — BP 120/78 | HR 86 | Temp 98.6°F | Ht 67.0 in | Wt 180.1 lb

## 2019-12-16 DIAGNOSIS — M546 Pain in thoracic spine: Secondary | ICD-10-CM

## 2019-12-16 MED ORDER — HYDROCODONE-ACETAMINOPHEN 5-325 MG PO TABS: 1.0000 | ORAL_TABLET | Freq: Four times a day (QID) | ORAL | 0 refills | Status: AC | PRN

## 2019-12-16 MED ORDER — METHYLPREDNISOLONE ACETATE 40 MG/ML IJ SUSP
40.0000 mg | Freq: Once | INTRAMUSCULAR | Status: AC
  Administered 2019-12-16: 40 mg via INTRAMUSCULAR

## 2019-12-16 MED ORDER — KETOROLAC TROMETHAMINE 30 MG/ML IJ SOLN
30.0000 mg | Freq: Once | INTRAMUSCULAR | Status: AC
  Administered 2019-12-16: 30 mg via INTRAMUSCULAR

## 2019-12-16 NOTE — Assessment & Plan Note (Addendum)
Her back pain is acute and severe. Given depo-medrol 40 mg IM and toradol 30 mg IM today in office. Short term hydrocodone to help and [place] narcotic database reviewed and appropriate. Reminded that in the future she is not allowed to take extra tramadol regardless of the cause without consulting [place]. She will have permission to fill tramadol early this month if needed but knows this is an exception.

## 2019-12-16 NOTE — Progress Notes (Signed)
   Subjective:   Patient ID: Sally Kidd, female    DOB: 03/29/1966, 54 y.o.   MRN: [id number]  HPI The patient is a 54 YO female coming in for new upper back injury. She had gotten home water delivery recently and was lifting 5 gallon container onto the dispenser and thought she was lifting properly. Has chronic low back pain so was very careful. Later that day/next day she felt a lot of pain in the chest and upper back muscles. Worsens with touching it, deep breathing, twisting. She tried taking extra tramadol to help but this did not help well. She also went to her massage therapist and chiropractor but they could not help either. She denies numbness or weakness down her arms. No pain in her neck. Denies falls. Overall mild improvement since onset but not much. Pain 10/10 when acute and 7/10 all the time. Still has chronic low back pain.   Review of Systems  Constitutional: Positive for activity change.  HENT: Negative.   Eyes: Negative.   Respiratory: Negative for cough, chest tightness and shortness of breath.   Cardiovascular: Negative for chest pain, palpitations and leg swelling.  Gastrointestinal: Negative for abdominal distention, abdominal pain, constipation, diarrhea, nausea and vomiting.  Musculoskeletal: Positive for back pain and myalgias. Negative for gait problem, joint swelling, neck pain and neck stiffness.  Skin: Negative.   Neurological: Negative.   Psychiatric/Behavioral: Negative.     Objective:  Physical Exam Constitutional:      Appearance: She is well-developed.  HENT:     Head: Normocephalic and atraumatic.  Cardiovascular:     Rate and Rhythm: Normal rate and regular rhythm.  Pulmonary:     Effort: Pulmonary effort is normal. No respiratory distress.     Breath sounds: Normal breath sounds. No wheezing or rales.     Comments: Pain to palpation and with twisting in the right and left chest wall muscles.  Chest:     Chest wall: Tenderness present.    Abdominal:     General: Bowel sounds are normal. There is no distension.     Palpations: Abdomen is soft.     Tenderness: There is no abdominal tenderness. There is no rebound.  Musculoskeletal:        General: Tenderness present.     Cervical back: Normal range of motion.     Comments: Pain thoracic region and scapular region left and right  Skin:    General: Skin is warm and dry.  Neurological:     Mental Status: She is alert and oriented to person, place, and time.     Coordination: Coordination normal.     Vitals:   12/16/19 1438  BP: 120/78  Pulse: 86  Temp: 98.6 F (37 C)  TempSrc: Oral  SpO2: 96%  Weight: 180 lb 2 oz (81.7 kg)  Height: 5\' 7"  (1.702 m)    This visit occurred during the SARS-CoV-2 public health emergency.  Safety protocols were in place, including screening questions prior to the visit, additional usage of staff PPE, and extensive cleaning of exam room while observing appropriate contact time as indicated for disinfecting solutions.   Assessment & Plan:  Depo-medrol 40 mg IM and toradol 30 mg IM given at visit

## 2019-12-16 NOTE — Patient Instructions (Addendum)
We have given you the steroid and the toradol shot today.  We have sent in hydrocodone to use for pain for this.

## 2019-12-24 ENCOUNTER — Telehealth: Payer: Self-pay | Admitting: Internal Medicine

## 2019-12-24 ENCOUNTER — Other Ambulatory Visit: Payer: Self-pay | Admitting: Internal Medicine

## 2019-12-24 NOTE — Telephone Encounter (Signed)
New message:  1.Medication Requested: traMADol (ULTRAM) 50 MG tablet 2. Pharmacy (Name, Street, [place]): [facility] - [place], [place] - [address] 3. On Med List:  Yes 4. Last Visit with PCP:   5. Next visit date with PCP:  Pt states she was told to call if the Hydrocodone didn't work for her and the patient would like to start back taking the Tramadol Agent: Please be advised that RX refills may take up to 3 business days. We ask that you follow-up with your pharmacy.

## 2019-12-24 NOTE — Telephone Encounter (Signed)
Check [place] registry last filled 12/03/2019. Dr. Okey Dupre is out of the office this week pls advise on refill.Marland KitchenRaechel Chute

## 2019-12-25 MED ORDER — TRAMADOL HCL 50 MG PO TABS: ORAL_TABLET | ORAL | 0 refills | Status: DC

## 2019-12-25 NOTE — Telephone Encounter (Signed)
  Patient calling for status of refill Scheduler advised patient to allow additional time for request

## 2019-12-25 NOTE — Telephone Encounter (Signed)
Done erx 

## 2019-12-26 ENCOUNTER — Telehealth: Payer: Self-pay

## 2019-12-26 NOTE — Telephone Encounter (Signed)
Tramadol was sent in yesterday by Dr Jonny Ruiz -- this is an early refill.  Per Dr Frutoso Chase not she was ok for an early refill this once only.     Please call pharmacy and advise ok to fill early this one time

## 2019-12-26 NOTE — Telephone Encounter (Signed)
Called and left a message for patient this morning that script was approved and sent in yesterday.

## 2019-12-26 NOTE — Telephone Encounter (Signed)
New message   Seen on 3.15.2021  The patient is aware Dr. Okey Dupre is out on vacation.  The patient calls taken traMADol (ULTRAM) 50 MG tablet and was given Vicodin by Dr. Okey Dupre due to a recent injury.   The patient saying Vicodin is too strong still has pain.   Tramadol was written do not dispense until 3.30.21, patient unable to wait, the pharmacy stated the MD will need to remove dispense date so the pharmacy can refill medication.  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    Asking for a callback today.   [facility] - [place], [place] - [address]

## 2019-12-26 NOTE — Telephone Encounter (Signed)
F/u    The patient calling back to speak with the nurse regarding previous messages

## 2019-12-27 NOTE — Telephone Encounter (Signed)
Called pharmacy to verify if Tramadol script was filled. Spoke w/pharmacist Renee she states yes pt picked up rxc on yesterday. Closing encounter.Marland KitchenRaechel Chute

## 2020-01-23 ENCOUNTER — Telehealth: Payer: Self-pay | Admitting: Internal Medicine

## 2020-01-23 NOTE — Telephone Encounter (Signed)
Dr. Okey Dupre is it okay if I send this work note?

## 2020-01-23 NOTE — Telephone Encounter (Signed)
New Message:   Pt is calling and states she needs a note for work that explains the type of medication she is on and it may make her sick at times. She states it's for days she may be running late or can't come to work due to the medication she is currently on. She states it can be mailed to her address in her chart. Please advise.

## 2020-01-23 NOTE — Telephone Encounter (Signed)
Needs visit to discuss symptoms related to medication and assess if change to medication is more appropriate.

## 2020-01-24 NOTE — Telephone Encounter (Signed)
Appointment scheduled for 02-06-2020 @4pm 

## 2020-02-06 ENCOUNTER — Ambulatory Visit: Payer: BC Managed Care – PPO | Admitting: Internal Medicine

## 2020-02-06 DIAGNOSIS — Z0289 Encounter for other administrative examinations: Secondary | ICD-10-CM

## 2020-03-04 ENCOUNTER — Ambulatory Visit: Payer: BC Managed Care – PPO | Admitting: Internal Medicine

## 2020-03-04 ENCOUNTER — Encounter: Payer: Self-pay | Admitting: Internal Medicine

## 2020-03-10 ENCOUNTER — Ambulatory Visit: Payer: BC Managed Care – PPO | Admitting: Internal Medicine

## 2020-03-10 ENCOUNTER — Other Ambulatory Visit: Payer: Self-pay

## 2020-03-10 ENCOUNTER — Encounter: Payer: Self-pay | Admitting: Internal Medicine

## 2020-03-10 VITALS — BP 116/84 | HR 93 | Temp 99.1°F | Ht 67.0 in | Wt 176.0 lb

## 2020-03-10 DIAGNOSIS — M545 Low back pain: Secondary | ICD-10-CM

## 2020-03-10 DIAGNOSIS — G8929 Other chronic pain: Secondary | ICD-10-CM | POA: Diagnosis not present

## 2020-03-10 MED ORDER — METHYLPREDNISOLONE ACETATE 40 MG/ML IJ SUSP
40.0000 mg | Freq: Once | INTRAMUSCULAR | Status: AC
  Administered 2020-03-10: 40 mg via INTRAMUSCULAR

## 2020-03-10 NOTE — Patient Instructions (Signed)
We have given you the steroid shot today for the back.

## 2020-03-10 NOTE — Progress Notes (Signed)
   Subjective:   Patient ID: Sally Kidd, female    DOB: 26-Jan-1966, 54 y.o.   MRN: [id number]  HPI The patient is a 54 YO female coming in for chronic back pain. She is using her chronic tramadol for pain. Denies side effects for this such as excessive sedation. There had been some miscommunication with our office. She was asked to return to work in person and had been waking up and arriving late to work. She was getting feedback about this and was trying to put this on her back. She then needed note that she is treated for this which caused [place] alarm that she was having more side effects. She denies this and is now working harder on getting to work on time. She does have chronic low back pain. Did get significant relief with depo-medrol shot previously and wishes to do this again today. Has resolution of the upper back pain she was previously having.   Review of Systems  Constitutional: Positive for activity change.  HENT: Negative.   Eyes: Negative.   Respiratory: Negative for cough, chest tightness and shortness of breath.   Cardiovascular: Negative for chest pain, palpitations and leg swelling.  Gastrointestinal: Negative for abdominal distention, abdominal pain, constipation, diarrhea, nausea and vomiting.  Musculoskeletal: Positive for arthralgias and back pain.  Skin: Negative.   Neurological: Negative.   Psychiatric/Behavioral: Negative.     Objective:  Physical Exam Constitutional:      Appearance: She is well-developed.  HENT:     Head: Normocephalic and atraumatic.  Cardiovascular:     Rate and Rhythm: Normal rate and regular rhythm.  Pulmonary:     Effort: Pulmonary effort is normal. No respiratory distress.     Breath sounds: Normal breath sounds. No wheezing or rales.  Abdominal:     General: Bowel sounds are normal. There is no distension.     Palpations: Abdomen is soft.     Tenderness: There is no abdominal tenderness. There is no rebound.  Musculoskeletal:         General: Tenderness present.     Cervical back: Normal range of motion.     Comments: Lumbar tenderness  Skin:    General: Skin is warm and dry.  Neurological:     Mental Status: She is alert and oriented to person, place, and time.     Coordination: Coordination normal.     Vitals:   03/10/20 1409  BP: 116/84  Pulse: 93  Temp: 99.1 F (37.3 C)  TempSrc: Oral  SpO2: 96%  Weight: 176 lb (79.8 kg)  Height: 5\' 7"  (1.702 m)   This visit occurred during the SARS-CoV-2 public health emergency.  Safety protocols were in place, including screening questions prior to the visit, additional usage of staff PPE, and extensive cleaning of exam room while observing appropriate contact time as indicated for disinfecting solutions.   Assessment & Plan:  Depo-medrol 40 mg IM given at visit  Visit time 30 minutes in face to face communication with patient and coordination of care, additional 10 minutes spent in record review, coordination or care, ordering tests, communicating/referring to other healthcare professionals, documenting in medical records all on the same day of the visit for total time 40 minutes spent on the visit.

## 2020-03-11 NOTE — Assessment & Plan Note (Addendum)
Depo-medrol 40 mg IM given at visit. Note given that she is under care for back pain and is on medications. [place] narcotic database reviewed today.

## 2020-03-30 ENCOUNTER — Other Ambulatory Visit: Payer: Self-pay | Admitting: Internal Medicine

## 2020-03-30 ENCOUNTER — Telehealth: Payer: Self-pay

## 2020-03-30 DIAGNOSIS — Z1231 Encounter for screening mammogram for malignant neoplasm of breast: Secondary | ICD-10-CM

## 2020-03-30 NOTE — Telephone Encounter (Signed)
Pt has been informed and expressed understanding.  

## 2020-03-30 NOTE — Telephone Encounter (Signed)
Pt states that she has a mosquito bite on her are that is red, irritated and itchy. She stated that she used some of her dogs ear cream that has hydrocortisone in it to get some relief. She stated nothing else she used worked. She would like to know what should she do? I offered an OV but she wanted PCP advise first.

## 2020-03-30 NOTE — Telephone Encounter (Signed)
Can use benadryl cream for itching and hydrocortisone cream 1% over the counter to help. Avoid scratching. If redness spreading needs visit.

## 2020-03-30 NOTE — Telephone Encounter (Signed)
   New message    Patient C/o left elbow arm in the middle big round red.   I offered the patient an appointment to see Dr. Okey Dupre the patient declined voiced wanted a message sent around to the CMA for review.

## 2020-04-07 ENCOUNTER — Other Ambulatory Visit: Payer: Self-pay | Admitting: Internal Medicine

## 2020-04-07 NOTE — Telephone Encounter (Signed)
Done erx 

## 2020-04-30 ENCOUNTER — Ambulatory Visit
Admission: RE | Admit: 2020-04-30 | Discharge: 2020-04-30 | Disposition: A | Discharge status: Home / Self Care | Payer: BC Managed Care – PPO | Source: Ambulatory Visit | Attending: Internal Medicine | Admitting: Internal Medicine

## 2020-04-30 ENCOUNTER — Other Ambulatory Visit: Payer: Self-pay

## 2020-04-30 DIAGNOSIS — Z1231 Encounter for screening mammogram for malignant neoplasm of breast: Secondary | ICD-10-CM

## 2020-05-03 ENCOUNTER — Other Ambulatory Visit: Payer: Self-pay | Admitting: Internal Medicine

## 2020-05-04 NOTE — Telephone Encounter (Signed)
04/07/2020 Tramadol Hcl 50 Mg Tablet 100#  Last ov 03/10/20 Next ov n/s

## 2020-06-12 ENCOUNTER — Telehealth (INDEPENDENT_AMBULATORY_CARE_PROVIDER_SITE_OTHER): Payer: BC Managed Care – PPO | Admitting: Internal Medicine

## 2020-06-12 DIAGNOSIS — B349 Viral infection, unspecified: Secondary | ICD-10-CM

## 2020-06-12 NOTE — Progress Notes (Signed)
Patient ID: Sally Kidd, female   DOB: Aug 23, 1966, 54 y.o.   MRN: [id number]  Virtual Visit via Video Note  I connected with [name] on 06/12/20 at  9:20 AM EDT by a video enabled telemedicine application and verified that I am speaking with the correct person using two identifiers.  Location of all participants today Patient: at home Provider: at office   I discussed the limitations of evaluation and management by telemedicine and the availability of in person appointments. The patient expressed understanding and agreed to proceed.  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 Back pain    back injury due to fall in 2013  . Constipation   . DDD (degenerative disc disease), lumbar   . Hemorrhoids    Past Surgical History:  Procedure Laterality Date  . ABDOMINAL HYSTERECTOMY    . BREAST BIOPSY     Bil/ benign    reports that she has never smoked. She has never used smokeless tobacco. She reports current alcohol use of about 2.0 standard drinks of alcohol per week. She reports that she does not use drugs. family history includes Cancer in her father; Diabetes in her mother and sister; Hyperlipidemia in her mother and sister; Hypertension in her sister. No Known Allergies Current Outpatient Medications on File Prior to Visit  Medication Sig Dispense Refill  . atorvastatin (LIPITOR) 20 MG tablet Take 1 tablet (20 mg total) by mouth daily. 90 tablet 3  . Biotin 7500 MCG TABS Take by mouth daily. Reported on 04/04/2016    . cetirizine (ZYRTEC) 10 MG tablet Take 20 mg by mouth as needed. Reported on 04/04/2016    . diphenhydrAMINE (BENADRYL) 25 MG tablet Take 25 mg  by mouth every 6 (six) hours as needed. Reported on 04/04/2016    . Misc Natural Products (COLON CLEANSER PO) Take 3 capsules by mouth once a week.    Marland Kitchen PARoxetine (PAXIL) 10 MG tablet Take 1 tablet (10 mg total) by mouth every morning. 90 tablet 3  . traMADol (ULTRAM) 50 MG tablet TAKE 1 OR 2 TABLETS THREE TIMES A DAY AS NEEDED FOR PAIN. 100 tablet 4   No current facility-administered medications on file prior to visit.    Observations/Objective: Alert, NAD, appropriate mood and affect, resps normal, cn 2-12 intact, moves all 4s, no visible rash or swelling Lab Results  Component Value Date   WBC 10.1 09/24/2019   HGB 13.2 09/24/2019   HCT 39.0 09/24/2019   PLT 271.0 09/24/2019   GLUCOSE 97 09/24/2019   CHOL 195 09/24/2019   TRIG 133.0 09/24/2019   HDL 62.90 09/24/2019   LDLCALC 105 (H) 09/24/2019   ALT 24 09/24/2019   AST 23 09/24/2019   NA 140 09/24/2019   K 3.7 09/24/2019   CL 103 09/24/2019   CREATININE 0.90 09/24/2019   BUN 12 09/24/2019   CO2 30 09/24/2019   TSH 1.34 04/12/2018   HGBA1C 5.8 09/24/2019   Assessment and Plan: See notes  Follow Up Instructions: Seen notes   I discussed the assessment and treatment plan with the patient. The patient was provided an opportunity to ask questions and all were answered. The  patient agreed with the plan and demonstrated an understanding of the instructions.   The patient was advised to call back or seek an in-person evaluation if the symptoms worsen or if the condition fails to improve as anticipated.  Oliver Barre, MD

## 2020-06-13 ENCOUNTER — Encounter: Payer: Self-pay | Admitting: Internal Medicine

## 2020-06-13 DIAGNOSIS — B349 Viral infection, unspecified: Secondary | ICD-10-CM | POA: Insufficient documentation

## 2020-06-13 NOTE — Assessment & Plan Note (Signed)
Pt encouraged for covid testing, hold on return to work until neg, o/w tylenol prn, rest, fluids

## 2020-06-13 NOTE — Patient Instructions (Signed)
Please go for covid testing

## 2020-08-12 ENCOUNTER — Other Ambulatory Visit: Payer: Self-pay | Admitting: Internal Medicine

## 2020-09-01 NOTE — Telephone Encounter (Signed)
[place] Controlled Database Checked Last filled:  08/14/20  # 90 15 days LOV w/you: 03/10/20 Next appt w/you:  None

## 2020-09-01 NOTE — Telephone Encounter (Signed)
It would appear she has been consistently filling this early and would need in person visit for refill. #100 is a 1 month supply so should not be out with #90 for about 26 days after fill.

## 2020-09-02 NOTE — Telephone Encounter (Signed)
Patient is scheduled 09/04/20 @ 8:20 am.

## 2020-09-04 ENCOUNTER — Other Ambulatory Visit (INDEPENDENT_AMBULATORY_CARE_PROVIDER_SITE_OTHER): Payer: BC Managed Care – PPO

## 2020-09-04 ENCOUNTER — Other Ambulatory Visit: Payer: Self-pay

## 2020-09-04 ENCOUNTER — Ambulatory Visit: Payer: BC Managed Care – PPO | Admitting: Internal Medicine

## 2020-09-04 ENCOUNTER — Encounter: Payer: Self-pay | Admitting: Internal Medicine

## 2020-09-04 VITALS — BP 112/82 | HR 88 | Temp 98.6°F | Wt 175.0 lb

## 2020-09-04 DIAGNOSIS — F1120 Opioid dependence, uncomplicated: Secondary | ICD-10-CM | POA: Diagnosis not present

## 2020-09-04 DIAGNOSIS — E782 Mixed hyperlipidemia: Secondary | ICD-10-CM

## 2020-09-04 DIAGNOSIS — G8929 Other chronic pain: Secondary | ICD-10-CM

## 2020-09-04 DIAGNOSIS — M545 Low back pain, unspecified: Secondary | ICD-10-CM | POA: Diagnosis not present

## 2020-09-04 DIAGNOSIS — Z9109 Other allergy status, other than to drugs and biological substances: Secondary | ICD-10-CM | POA: Diagnosis not present

## 2020-09-04 LAB — COMPREHENSIVE METABOLIC PANEL
ALT: 13 U/L (ref 0–35)
AST: 18 U/L (ref 0–37)
Albumin: 4.3 g/dL (ref 3.5–5.2)
Alkaline Phosphatase: 52 U/L (ref 39–117)
BUN: 9 mg/dL (ref 6–23)
CO2: 28 mEq/L (ref 19–32)
Calcium: 9.6 mg/dL (ref 8.4–10.5)
Chloride: 104 mEq/L (ref 96–112)
Creatinine, Ser: 0.86 mg/dL (ref 0.40–1.20)
GFR: 76.01 mL/min (ref >60.00)
Glucose, Bld: 127 mg/dL — ABNORMAL HIGH (ref 70–99)
Potassium: 3.9 mEq/L (ref 3.5–5.1)
Sodium: 141 mEq/L (ref 135–145)
Total Bilirubin: 0.5 mg/dL (ref 0.2–1.2)
Total Protein: 7.3 g/dL (ref 6.0–8.3)

## 2020-09-04 LAB — CBC WITH DIFFERENTIAL/PLATELET
Basophils Absolute: 0.1 10*3/uL (ref 0.0–0.1)
Basophils Relative: 0.8 % (ref 0.0–3.0)
Eosinophils Absolute: 0.1 10*3/uL (ref 0.0–0.7)
Eosinophils Relative: 1.8 % (ref 0.0–5.0)
HCT: 37.8 % (ref 36.0–46.0)
Hemoglobin: 13.3 g/dL (ref 12.0–15.0)
Lymphocytes Relative: 37 % (ref 12.0–46.0)
Lymphs Abs: 2.7 10*3/uL (ref 0.7–4.0)
MCHC: 35 g/dL (ref 30.0–36.0)
MCV: 82.5 fl (ref 78.0–100.0)
Monocytes Absolute: 0.6 10*3/uL (ref 0.1–1.0)
Monocytes Relative: 8.3 % (ref 3.0–12.0)
Neutro Abs: 3.8 10*3/uL (ref 1.4–7.7)
Neutrophils Relative %: 52.1 % (ref 43.0–77.0)
Platelets: 249 10*3/uL (ref 150.0–400.0)
RBC: 4.58 Mil/uL (ref 3.87–5.11)
RDW: 13 % (ref 11.5–15.5)
WBC: 7.4 10*3/uL (ref 4.0–10.5)

## 2020-09-04 LAB — LIPID PANEL
Cholesterol: 257 mg/dL — ABNORMAL HIGH (ref 0–200)
HDL: 43.8 mg/dL (ref >39.00)
NonHDL: 213.58
Total CHOL/HDL Ratio: 6
Triglycerides: 246 mg/dL — ABNORMAL HIGH (ref 0.0–149.0)
VLDL: 49.2 mg/dL — ABNORMAL HIGH (ref 0.0–40.0)

## 2020-09-04 LAB — LDL CHOLESTEROL, DIRECT: Direct LDL: 162 mg/dL

## 2020-09-04 LAB — HEMOGLOBIN A1C: Hgb A1c MFr Bld: 6.2 % (ref 4.6–6.5)

## 2020-09-04 MED ORDER — MONTELUKAST SODIUM 10 MG PO TABS: 10.0000 mg | ORAL_TABLET | Freq: Every day | ORAL | 3 refills | Status: DC

## 2020-09-04 MED ORDER — TRAMADOL HCL 50 MG PO TABS: ORAL_TABLET | ORAL | 0 refills | Status: DC

## 2020-09-04 MED ORDER — PAROXETINE HCL 10 MG PO TABS
10.0000 mg | ORAL_TABLET | Freq: Every morning | ORAL | 3 refills | Status: DC
Start: 2020-09-04 — End: 2021-10-01

## 2020-09-04 NOTE — Progress Notes (Signed)
   Subjective:   Patient ID: Sally Kidd, female    DOB: [birth year], 54 y.o.   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, does use tramadol #100 per month, is filling early and cannot say why) chronic pain management (on pain contract for #100 per month tramadol, review of database revealed many early fills in the last 6 months, she does not have an explanation of this and asks if "I am really going to make her think about this", also she states that she hides medications from herself in multiple locations at home and sometimes feels she cannot find some, and wants to get ahead due to weekends etc, and may lose track of taking medications, denies flare of pain recently or worsening of her chronic pain, she is not sure why she is filling early, admits to have about 3-4 pills at home currently) and sinus issues (does have allergies which are triggered by being at work, taking zyrtec for this sometimes, would like something stronger for the sinuses, denies fevers or chills).   Review of Systems  Constitutional: Positive for activity change. Negative for appetite change, chills, fatigue, fever and unexpected weight change.  Respiratory: Negative.   Cardiovascular: Negative.   Gastrointestinal: Negative.   Musculoskeletal: Positive for arthralgias, back pain and myalgias. Negative for gait problem and joint swelling.  Skin: Negative.   Neurological: Negative.     Objective:  Physical Exam Constitutional:      Appearance: She is well-developed.  HENT:     Head: Normocephalic and atraumatic.  Cardiovascular:     Rate and Rhythm: Normal rate and regular rhythm.  Pulmonary:     Effort: Pulmonary effort is normal. No respiratory distress.     Breath sounds: Normal breath sounds. No wheezing or rales.  Abdominal:     General: Bowel sounds are normal. There is no distension.      Palpations: Abdomen is soft.     Tenderness: There is no abdominal tenderness. There is no rebound.  Musculoskeletal:     Cervical back: Normal range of motion.  Skin:    General: Skin is warm and dry.  Neurological:     Mental Status: She is alert and oriented to person, place, and time.     Coordination: Coordination normal.     Vitals:   09/04/20 0826  BP: 112/82  Pulse: 88  Temp: 98.6 F (37 C)  TempSrc: Oral  SpO2: 95%  Weight: 175 lb (79.4 kg)    This visit occurred during the SARS-CoV-2 public health emergency.  Safety protocols were in place, including screening questions prior to the visit, additional usage of staff PPE, and extensive cleaning of exam room while observing appropriate contact time as indicated for disinfecting solutions.   Assessment & Plan:

## 2020-09-04 NOTE — Assessment & Plan Note (Signed)
Rx singulair and continue zyrtec

## 2020-09-04 NOTE — Assessment & Plan Note (Signed)
Checking labs as she did stop taking her cholesterol medicine some time ago without notifying us.

## 2020-09-04 NOTE — Assessment & Plan Note (Signed)
It is concerning fill pattern from [place] narcotic database with multiple early fills in the last 6 months adding up to almost 25 days early. She admits to having 3-4 pills left which is about 1-2 day supply which is not appropriate. She does not have a explanation for where the missing pills may be. She is not sure if she is taking too many. Given additional copy of her pain contract today and advised that if she violates again we will not be able to prescribe this any longer as this is high risk behavior. Checking UDS today and will likely need frequent monitoring to assess depending on results. Needs visit in 3 months and will need more often monitoring.

## 2020-09-04 NOTE — Patient Instructions (Addendum)
We have given you an updated copy of the medication agreement today. We do need you to pay attention to the tramadol because if you continue to fill the tramadol early we will not be able to prescribe this for you as it is not acceptable behavior.   We would like to see you back in 3 months for a visit.   We are doing the labs today.  We have sent in singulair for the sinuses to take all the time.

## 2020-09-04 NOTE — Assessment & Plan Note (Signed)
It is concerning fill pattern from [place] narcotic database with multiple early fills in the last 6 months adding up to almost 25 days early. She admits to having 3-4 pills left which is about 1-2 day supply which is not appropriate. She does not have a explanation for where the missing pills may be. She is not sure if she is taking too many. Given additional copy of her pain contract today and advised that if she violates again we will not be able to prescribe this any longer as this is high risk behavior. Checking UDS today and will likely need frequent monitoring to assess depending on results. Needs visit in 3 months and will need more often monitoring.  

## 2020-09-08 ENCOUNTER — Telehealth: Payer: Self-pay

## 2020-09-08 ENCOUNTER — Other Ambulatory Visit: Payer: Self-pay | Admitting: Internal Medicine

## 2020-09-08 ENCOUNTER — Other Ambulatory Visit: Payer: BC Managed Care – PPO

## 2020-09-08 DIAGNOSIS — F1120 Opioid dependence, uncomplicated: Secondary | ICD-10-CM

## 2020-09-08 NOTE — Telephone Encounter (Signed)
Called to add on lab. Confirmed test & diagnosis code for lab ordered.

## 2020-09-08 NOTE — Telephone Encounter (Signed)
Request to Add Lab Test form successfully faxed to [phone].  Dr Okey Dupre states she spoke to [facility] rep who confirmed that urine is still available for testing.

## 2020-09-10 LAB — DRUG MONITORING, PANEL 8 WITH CONFIRMATION, URINE
6 Acetylmorphine: NEGATIVE ng/mL (ref <10)
Alcohol Metabolites: NEGATIVE ng/mL
Amphetamine: NEGATIVE ng/mL (ref <250)
Amphetamines: NEGATIVE ng/mL (ref <500)
Benzodiazepines: NEGATIVE ng/mL (ref <100)
Buprenorphine, Urine: NEGATIVE ng/mL (ref <5)
Cocaine Metabolite: NEGATIVE ng/mL (ref <150)
Codeine: NEGATIVE ng/mL (ref <50)
Creatinine: 138.1 mg/dL
Hydrocodone: NEGATIVE ng/mL (ref <50)
Hydromorphone: NEGATIVE ng/mL (ref <50)
MDMA: NEGATIVE ng/mL (ref <500)
Marijuana Metabolite: NEGATIVE ng/mL (ref <20)
Methamphetamine: NEGATIVE ng/mL (ref <250)
Morphine: NEGATIVE ng/mL (ref <50)
Norhydrocodone: NEGATIVE ng/mL (ref <50)
Opiates: NEGATIVE ng/mL (ref <100)
Oxidant: NEGATIVE ug/mL
Oxycodone: NEGATIVE ng/mL (ref <100)
pH: 7.3 (ref 4.5–9.0)

## 2020-09-10 LAB — DRUG MONITOR, TRAMADOL,QN, URINE
Desmethyltramadol: 4813 ng/mL — ABNORMAL HIGH (ref <100)
Tramadol: >10000 ng/mL — ABNORMAL HIGH (ref <100)

## 2020-09-10 LAB — DM TEMPLATE

## 2020-09-22 ENCOUNTER — Other Ambulatory Visit: Payer: Self-pay | Admitting: Internal Medicine

## 2020-09-22 ENCOUNTER — Ambulatory Visit: Payer: BC Managed Care – PPO | Admitting: Internal Medicine

## 2020-09-22 MED ORDER — PRAVASTATIN SODIUM 20 MG PO TABS: 20.0000 mg | ORAL_TABLET | Freq: Every day | ORAL | 3 refills | Status: DC

## 2020-09-28 ENCOUNTER — Other Ambulatory Visit: Payer: Self-pay | Admitting: Internal Medicine

## 2020-09-29 NOTE — Telephone Encounter (Signed)
[place] Controlled Database Checked Last filled:  09/04/20  # 90 LOV w/you:  09/04/20 Next appt w/you:  12/17/20

## 2020-09-30 NOTE — Telephone Encounter (Signed)
Patient calling for status of refill  

## 2020-10-27 ENCOUNTER — Other Ambulatory Visit: Payer: Self-pay | Admitting: Internal Medicine

## 2020-10-28 NOTE — Telephone Encounter (Signed)
LR: 10-04-2020 Qty: 100 with 0 refills  Last office visit: 09-04-2020 Upcoming appointment: 12-17-2020

## 2020-12-01 ENCOUNTER — Other Ambulatory Visit: Payer: Self-pay | Admitting: Internal Medicine

## 2020-12-08 ENCOUNTER — Encounter: Payer: Self-pay | Admitting: Internal Medicine

## 2020-12-08 ENCOUNTER — Other Ambulatory Visit: Payer: Self-pay

## 2020-12-08 ENCOUNTER — Ambulatory Visit: Payer: BC Managed Care – PPO | Admitting: Internal Medicine

## 2020-12-08 VITALS — BP 118/80 | HR 92 | Temp 99.2°F | Resp 18 | Ht 67.0 in | Wt 172.8 lb

## 2020-12-08 DIAGNOSIS — G8929 Other chronic pain: Secondary | ICD-10-CM

## 2020-12-08 DIAGNOSIS — E782 Mixed hyperlipidemia: Secondary | ICD-10-CM

## 2020-12-08 DIAGNOSIS — M545 Low back pain, unspecified: Secondary | ICD-10-CM

## 2020-12-08 DIAGNOSIS — F1120 Opioid dependence, uncomplicated: Secondary | ICD-10-CM

## 2020-12-08 NOTE — Progress Notes (Unsigned)
   Subjective:   Patient ID: Sally Kidd, female    DOB: [birth year], 55 y.o.   MRN: [id number]  HPI The patient is a 55 YO female coming in for follow up pain (taking tramadol #100 per month, denies increase in pain, overall stable, she did find the tramadol she had stored away after our last visit she did look for this, denies losing track of any since, denies side effects such as constipation) and cholesterol (did want to try medication, she has not been able to be active, working some on diet, taking pravastatin without side effects) and back pain (overall stable, still taking tramadol as prescribed, she did find the bottle she had stowed away which she could not recall well last visit, denies new injury or overuse).   Review of Systems  Constitutional: Positive for activity change. Negative for appetite change, chills, fatigue, fever and unexpected weight change.  HENT: Negative.   Eyes: Negative.   Respiratory: Negative.  Negative for cough, chest tightness and shortness of breath.   Cardiovascular: Negative.  Negative for chest pain, palpitations and leg swelling.  Gastrointestinal: Negative.  Negative for abdominal distention, abdominal pain, constipation, diarrhea, nausea and vomiting.  Musculoskeletal: Positive for arthralgias, back pain and myalgias. Negative for gait problem and joint swelling.  Skin: Negative.   Neurological: Negative.   Psychiatric/Behavioral: Negative.     Objective:  Physical Exam Constitutional:      Appearance: She is well-developed.  HENT:     Head: Normocephalic and atraumatic.  Cardiovascular:     Rate and Rhythm: Normal rate and regular rhythm.  Pulmonary:     Effort: Pulmonary effort is normal. No respiratory distress.     Breath sounds: Normal breath sounds. No wheezing or rales.  Abdominal:     General: Bowel sounds are normal. There is no distension.     Palpations: Abdomen is soft.     Tenderness: There is no abdominal tenderness.  There is no rebound.  Musculoskeletal:        General: Tenderness present.     Cervical back: Normal range of motion.  Skin:    General: Skin is warm and dry.  Neurological:     Mental Status: She is alert and oriented to person, place, and time.     Coordination: Coordination normal.     Vitals:   12/08/20 1603  BP: 118/80  Pulse: 92  Resp: 18  Temp: 99.2 F (37.3 C)  TempSrc: Oral  SpO2: 98%  Weight: 172 lb 12.8 oz (78.4 kg)  Height: 5\' 7"  (1.702 m)    This visit occurred during the SARS-CoV-2 public health emergency.  Safety protocols were in place, including screening questions prior to the visit, additional usage of staff PPE, and extensive cleaning of exam room while observing appropriate contact time as indicated for disinfecting solutions.   Assessment & Plan:

## 2020-12-08 NOTE — Patient Instructions (Signed)
We will recheck the cholesterol today and call you back about the results.

## 2020-12-10 NOTE — Assessment & Plan Note (Signed)
[place] database reviewed and appropriate. Last UDS appropriate. Contract on file. She is overall stable and would like to continue given the significant improvement in QOL.

## 2020-12-10 NOTE — Assessment & Plan Note (Signed)
Recheck lipid panel and she is taking pravastatin 20 mg daily now.

## 2020-12-10 NOTE — Assessment & Plan Note (Signed)
[place] database reviewed and appropriate without early fills since last visit. Prior UDS appropriate. She was able to find the medication after our last visit. Refill tramadol when due.

## 2020-12-17 ENCOUNTER — Ambulatory Visit: Payer: BC Managed Care – PPO | Admitting: Internal Medicine

## 2020-12-30 ENCOUNTER — Other Ambulatory Visit: Payer: Self-pay | Admitting: Internal Medicine

## 2021-01-01 NOTE — Telephone Encounter (Signed)
1.Medication Requested: traMADol (ULTRAM) 50 MG tablet    2. Pharmacy (Name, Street, [facility]): [facility] - [place], [place] - [address]  3. On Med List: yes   4. Last Visit with PCP: 12-08-20  5. Next visit date with PCP: n/a    Agent: Please be advised that RX refills may take up to 3 business days. We ask that you follow-up with your pharmacy.

## 2021-01-01 NOTE — Telephone Encounter (Signed)
Spoke with the patient and she stated that she has been taking her medication how her body tells her take it. She denies miss using her medication. She stated that she was also instructed by the pharmacy to take 1600 mg of ibuprofen in between her taking the tramadol. She has also taking epsom salt baths. She stated that she has to hide her pills from herself in order to have medication to take when she is pain. She confirms that she is taking her medication as prescribed. She then states that we don't know that pain that she feels. I advised the patient that Im sorry that she's in pain and that I will update Dr. Okey Dupre accordingly.

## 2021-01-01 NOTE — Telephone Encounter (Signed)
Patient has called the office stating she still is waiting for an update on the refill request. I mentioned that the request has been put in and can take up to 3 business days.  She mentioned that she has been waiting and Dr.Crawford knows her situation

## 2021-01-25 ENCOUNTER — Other Ambulatory Visit: Payer: Self-pay | Admitting: Internal Medicine

## 2021-02-10 ENCOUNTER — Other Ambulatory Visit: Payer: Self-pay

## 2021-02-10 ENCOUNTER — Telehealth (INDEPENDENT_AMBULATORY_CARE_PROVIDER_SITE_OTHER): Payer: BC Managed Care – PPO | Admitting: Internal Medicine

## 2021-02-10 DIAGNOSIS — Z20822 Contact with and (suspected) exposure to covid-19: Secondary | ICD-10-CM

## 2021-02-10 MED ORDER — PROMETHAZINE-DM 6.25-15 MG/5ML PO SYRP: 5.0000 mL | ORAL_SOLUTION | Freq: Four times a day (QID) | ORAL | 0 refills | Status: DC | PRN

## 2021-02-10 NOTE — Progress Notes (Signed)
Virtual Visit via Video Note  I connected with [name] on 02/10/21 at 11:00 AM EDT by a video enabled telemedicine application and verified that I am speaking with the correct person using two identifiers.  The patient and the provider were at separate locations throughout the entire encounter. Patient location: home, Provider location: work   I discussed the limitations of evaluation and management by telemedicine and the availability of in person appointments. The patient expressed understanding and agreed to proceed. The patient and the provider were the only parties present for the visit unless noted in HPI below.  History of Present Illness: The patient is a 55 y.o. female with visit for cough and congestion. Went to [place] last week and started having sinus problems. Did not remember to take the sinus medicine. Took zyrtec. Started getting worse yesterday when she went back to work with more cough and hoarse voice. Took singulair. Was coughing so much today and started throwing up.   Observations/Objective: Appearance: looks sick, breathing appears normal, casual grooming, abdomen does not appear distended, throat not well visualized but voice nasally and coughing during visit, mental status is A and O times 3  Assessment and Plan: See problem oriented charting  Follow Up Instructions: rx promethazine/dm cough syrup and covid-9 test ordered due to sick contacts, advised to quarantine until results return  I discussed the assessment and treatment plan with the patient. The patient was provided an opportunity to ask questions and all were answered. The patient agreed with the plan and demonstrated an understanding of the instructions.   The patient was advised to call back or seek an in-person evaluation if the symptoms worsen or if the condition fails to improve as anticipated.  Myrlene Broker, MD

## 2021-02-11 LAB — SARS-COV-2, NAA 2 DAY TAT

## 2021-02-11 LAB — NOVEL CORONAVIRUS, NAA: SARS-CoV-2, NAA: DETECTED — AB

## 2021-02-12 ENCOUNTER — Encounter: Payer: Self-pay | Admitting: Internal Medicine

## 2021-02-12 ENCOUNTER — Other Ambulatory Visit: Payer: Self-pay | Admitting: Internal Medicine

## 2021-02-12 DIAGNOSIS — Z20822 Contact with and (suspected) exposure to covid-19: Secondary | ICD-10-CM | POA: Insufficient documentation

## 2021-02-12 NOTE — Assessment & Plan Note (Signed)
Rx promethazine/dm cough syrup. 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 She is outside window for oral antiviral treatment.

## 2021-02-12 NOTE — Telephone Encounter (Signed)
   Patient requesting refill for promethazine-dextromethorphan (PROMETHAZINE-DM) 6.25-15 MG/5ML syrup  She states she has very little remaining

## 2021-02-22 ENCOUNTER — Telehealth: Payer: Self-pay | Admitting: Internal Medicine

## 2021-02-22 NOTE — Telephone Encounter (Signed)
Patient called and was wondering if there was anything else that she needed to do before returning to work on 5/25 after having Covid 19. She can be reached at [phone]. Please advise

## 2021-02-24 NOTE — Telephone Encounter (Signed)
Unable to get in contact with the patient due to the line being busy. I attempt to contact later today

## 2021-02-25 ENCOUNTER — Other Ambulatory Visit: Payer: Self-pay | Admitting: Internal Medicine

## 2021-02-25 NOTE — Telephone Encounter (Signed)
LR: 01/29/2021 Qty: 100 Last office visit: 55/08/2021( video visit), 12/08/2020 Upcoming appointment: No pending appointment

## 2021-03-17 ENCOUNTER — Other Ambulatory Visit: Payer: Self-pay | Admitting: Internal Medicine

## 2021-03-17 DIAGNOSIS — Z1231 Encounter for screening mammogram for malignant neoplasm of breast: Secondary | ICD-10-CM

## 2021-05-11 ENCOUNTER — Ambulatory Visit
Admission: RE | Admit: 2021-05-11 | Discharge: 2021-05-11 | Disposition: A | Discharge status: Home / Self Care | Payer: BC Managed Care – PPO | Source: Ambulatory Visit | Attending: Internal Medicine | Admitting: Internal Medicine

## 2021-05-11 ENCOUNTER — Other Ambulatory Visit: Payer: Self-pay

## 2021-05-11 DIAGNOSIS — Z1231 Encounter for screening mammogram for malignant neoplasm of breast: Secondary | ICD-10-CM

## 2021-07-01 ENCOUNTER — Telehealth (INDEPENDENT_AMBULATORY_CARE_PROVIDER_SITE_OTHER): Payer: BC Managed Care – PPO | Admitting: Family Medicine

## 2021-07-01 ENCOUNTER — Encounter: Payer: Self-pay | Admitting: Family Medicine

## 2021-07-01 DIAGNOSIS — R059 Cough, unspecified: Secondary | ICD-10-CM | POA: Diagnosis not present

## 2021-07-01 DIAGNOSIS — R0981 Nasal congestion: Secondary | ICD-10-CM

## 2021-07-01 MED ORDER — PROMETHAZINE-DM 6.25-15 MG/5ML PO SYRP: 5.0000 mL | ORAL_SOLUTION | Freq: Four times a day (QID) | ORAL | 0 refills | Status: DC | PRN

## 2021-07-01 NOTE — Progress Notes (Signed)
Virtual Visit via Video Note  I connected with Tailor  on 07/01/21 at 12:00 PM EDT by a video enabled telemedicine application and verified that I am speaking with the correct person using two identifiers.  Location patient: home, [place] Location provider:work or home office Persons participating in the virtual visit: patient, provider  I discussed the limitations of evaluation and management by telemedicine and the availability of in person appointments. The patient expressed understanding and agreed to proceed.   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  Past Medical History:  Diagnosis Date   Back pain    back injury due to fall in 2013   Constipation    DDD (degenerative disc disease), lumbar    Hemorrhoids     Past Surgical History:  Procedure Laterality Date   ABDOMINAL HYSTERECTOMY     BREAST BIOPSY     Bil/ benign     Current Outpatient Medications:    cetirizine (ZYRTEC) 10 MG tablet, Take 20 mg by mouth as needed. Reported on 04/04/2016, Disp: , Rfl:    diphenhydrAMINE (BENADRYL) 25 MG tablet, Take 25 mg by mouth every 6 (six) hours as needed. Reported on 04/04/2016, Disp: , Rfl:    Misc Natural Products (COLON CLEANSER PO), Take 3 capsules by mouth once a week., Disp: , Rfl:    montelukast (SINGULAIR) 10 MG tablet, Take 1 tablet (10 mg total) by mouth at bedtime., Disp: 30 tablet, Rfl: 3   PARoxetine (PAXIL) 10 MG tablet, Take 1 tablet (10 mg total) by mouth every morning., Disp: 90 tablet, Rfl: 3   pravastatin (PRAVACHOL) 20 MG tablet, Take 1 tablet (20  mg total) by mouth daily., Disp: 90 tablet, Rfl: 3   traMADol (ULTRAM) 50 MG tablet, TAKE 1 TABLET (50MG ) BY MOUTH EVERY 6 HOURS AS NEEDED., Disp: 100 tablet, Rfl: 0   promethazine-dextromethorphan (PROMETHAZINE-DM) 6.25-15 MG/5ML syrup, Take 5 mLs by mouth 4 (four) times daily as needed for cough., Disp: 118 mL, Rfl: 0  EXAM:  VITALS per patient if applicable:  GENERAL: alert, oriented, appears well and in no acute distress  HEENT: atraumatic, conjunttiva clear, no obvious abnormalities on inspection of external nose and ears  NECK: normal movements of the head and neck  LUNGS: on inspection no signs of respiratory distress, breathing rate appears normal, no obvious gross SOB, gasping or wheezing  CV: no obvious cyanosis  MS: moves all visible extremities without noticeable abnormality  PSYCH/NEURO: pleasant and cooperative, no obvious depression or anxiety, speech and thought processing grossly intact  ASSESSMENT AND PLAN:  Discussed the following assessment and plan:  Nasal congestion  Cough  -we discussed possible serious and likely etiologies, options for evaluation and workup, limitations of telemedicine visit vs in person visit, treatment, treatment risks and precautions. Pt is agreeable to treatment via telemedicine at this moment. Query allergies, VURI, Covid19 vs other. She plans to do home testing. She requested promethazine cough medication - rx sent. Discussed changing to allegra, trial nasal saline, possible INS, and other symptomatic care measures summarized in pt instructions.  Work/School slipped offered: provided in patient instructions    Advised  to seek prompt in person care if worsening, new symptoms arise, or if is not improving with treatment. Discussed options for inperson care if PCP office not available. Did let this patient know that I only do telemedicine on Tuesdays and Thursdays for [facility]. Advised to schedule follow up visit with PCP or [facility] if any  further questions or concerns to avoid delays in care.   I discussed the assessment and treatment plan with the patient. The patient was provided an opportunity to ask questions and all were answered. The patient agreed with the plan and demonstrated an understanding of the instructions.     Terressa Koyanagi, DO

## 2021-07-01 NOTE — Patient Instructions (Addendum)
   ---------------------------------------------------------------------------------------------------------------------------      WORK SLIP:  Patient Sally Kidd,  06-23-1966, was seen for a medical visit today, 07/01/21 . Please excuse from work for a COVID like illness. If the covid test is positive, advise 10 days minimum from the onset of symptoms (06/29/21) PLUS 1 day of no fever and improved symptoms. Will defer to employer for a sooner return to work if symptoms have resolved and testing is negative OR, it is greater than 5 days since the positive test and the patient can wear a high-quality, tight fitting mask such as N95 or KN95 at all times for an additional 5 days. Would also suggest COVID19 antigen testing is negative prior to return.  Sincerely: E-signature: Dr. Kriste Basque, DO [facility] Ph: [phone]   ------------------------------------------------------------------------------------------------------------------------------   HOME CARE TIPS:  -CCOVID19 testing information:  Most pharmacies also offer testing and home test kits. Make sure to do at least 2 tests 48 hours apart if negative on the first test.  -I sent the medication(s) we discussed to your pharmacy: Meds ordered this encounter  Medications   promethazine-dextromethorphan (PROMETHAZINE-DM) 6.25-15 MG/5ML syrup    Sig: Take 5 mLs by mouth 4 (four) times daily as needed for cough.    Dispense:  118 mL    Refill:  0     -could try switching from Zyrtec to Allegra and doing flonase 2 sprays each nostril daily for 2 weeks if not already using.   -can use tylenol or aleve if needed for fevers, aches and pains per instructions  -can use nasal saline a few times per day if you have nasal congestion; sometimes  a short course of Afrin nasal spray for 3 days can help with symptoms as well  -stay hydrated, drink plenty of fluids and eat small healthy meals - avoid  dairy  -can take 1000 IU ( ) Vit D3 and 100-500 mg of Vit C daily per instructions  -If the Covid test is positive, check out the [facility] website for more information on home care, transmission and treatment for COVID19  -follow up with your doctor in 2-3 days unless improving and feeling better  -stay home while sick, except to seek medical care. If you have COVID19, ideally it would be best to stay home for a full 10 days since the onset of symptoms PLUS one day of no fever and feeling better. Wear a good mask that fits snugly (such as N95 or KN95) if around others to reduce the risk of transmission.  It was nice to meet you today, and I really hope you are feeling better soon. I help [facility] out with telemedicine visits on Tuesdays and Thursdays and am available for visits on those days. If you have any concerns or questions following this visit please schedule a follow up visit with your Primary Care doctor or seek care at a local urgent care clinic to avoid delays in care.    Seek in person care or schedule a follow up video visit promptly if your symptoms worsen, new concerns arise or you are not improving with treatment. Call 911 and/or seek emergency care if your symptoms are severe or life threatening.

## 2021-10-01 ENCOUNTER — Other Ambulatory Visit: Payer: Self-pay | Admitting: Internal Medicine

## 2021-11-19 ENCOUNTER — Other Ambulatory Visit: Payer: Self-pay | Admitting: Internal Medicine

## 2021-11-29 ENCOUNTER — Telehealth: Payer: Self-pay | Admitting: Internal Medicine

## 2021-11-29 NOTE — Telephone Encounter (Signed)
Pt states she developed a sinus infection over the weekend  Pt requesting a rx for antibiotics, informed pt an appt is needed, pt declined appt stating provider has sent in a rx w/o her being seen  [place], [place]

## 2021-11-30 NOTE — Telephone Encounter (Signed)
Typically sinus symptoms do not require antibiotics for at least 2 weeks mostly viral or allergic prior to that. I am happy to see her in person or virtually to assess.

## 2021-11-30 NOTE — Telephone Encounter (Signed)
Pt requesting antibiotic medication for sinus infection, denies an appointment.

## 2021-12-04 ENCOUNTER — Other Ambulatory Visit: Payer: Self-pay

## 2021-12-04 ENCOUNTER — Ambulatory Visit
Admission: EM | Admit: 2021-12-04 | Discharge: 2021-12-04 | Disposition: A | Discharge status: Home / Self Care | Payer: BC Managed Care – PPO | Attending: Physician Assistant | Admitting: Physician Assistant

## 2021-12-04 DIAGNOSIS — J019 Acute sinusitis, unspecified: Secondary | ICD-10-CM

## 2021-12-04 MED ORDER — AMOXICILLIN-POT CLAVULANATE 875-125 MG PO TABS: 1.0000 | ORAL_TABLET | Freq: Two times a day (BID) | ORAL | 0 refills | Status: DC

## 2021-12-04 NOTE — ED Triage Notes (Signed)
Over 1 wk h/o waxing and waning sinus pressure, runny nose and congestion. Pt also complains of itching and burning in the roof of her mouth. ?Has been using nasal sprays and otc meds without relief. Also used warm compress with temporary relief. ?Needs work note. ?

## 2021-12-04 NOTE — ED Provider Notes (Signed)
?[facility] ? ? ? ?CSN: [id number] ?Arrival date & time: 12/04/21  2595 ? ? ?  ? ?History   ?Chief Complaint ?Chief Complaint  ?Patient presents with  ? Nasal Congestion  ? ? ?HPI ?Sally Kidd is a 56 y.o. female.  ? ?Patient here today for evaluation of waxing and waning sinus pressure, runny nose and congestion.  She reports some itching and burning in the roof of her mouth.  She has been trying to use nasal sprays and over-the-counter medication without significant relief.  Symptoms have been ongoing for the last week.  She denies any nausea or vomiting. ? ?The history is provided by the patient.  ? ?Past Medical History:  ?Diagnosis Date  ? Back pain   ? back injury due to fall in 2013  ? Constipation   ? DDD (degenerative disc disease), lumbar   ? Hemorrhoids   ? ? ?Patient Active Problem List  ? Diagnosis Date Noted  ? Suspected COVID-19 virus infection 02/12/2021  ? Opioid dependence (HCC) 09/04/2020  ? Environmental allergies 09/04/2020  ? Hyperlipidemia 03/29/2019  ? Benign skin cyst 09/25/2018  ? Routine general medical examination at a health care facility 04/03/2015  ? Back pain 10/14/2014  ? Insomnia 10/14/2014  ? ? ?Past Surgical History:  ?Procedure Laterality Date  ? ABDOMINAL HYSTERECTOMY    ? BREAST BIOPSY    ? Bil/ benign  ? ? ?OB History   ?No obstetric history on file. ?  ? ? ? ?Home Medications   ? ?Prior to Admission medications   ?Medication Sig Start Date End Date Taking? Authorizing Provider  ?amoxicillin-clavulanate (AUGMENTIN) 875-125 MG tablet Take 1 tablet by mouth every 12 (twelve) hours. 12/04/21  Yes Tomi Bamberger, PA-C  ?cetirizine (ZYRTEC) 10 MG tablet Take 20 mg by mouth as needed. Reported on 04/04/2016    [provider]  ?diphenhydrAMINE (BENADRYL) 25 MG tablet Take 25 mg by mouth every 6 (six) hours as needed. Reported on 04/04/2016    [provider]  ?Misc Natural Products (COLON CLEANSER PO) Take 3 capsules by mouth once a week.    [provider]  ?montelukast (SINGULAIR) 10 MG tablet Take 1 tablet (10 mg total) by mouth at bedtime. 10/01/21   Myrlene Broker, MD  ?PARoxetine (PAXIL) 10 MG tablet TAKE ONE TABLET EVERY MORNING 11/23/21   Myrlene Broker, MD  ?pravastatin (PRAVACHOL) 20 MG tablet Take 1 tablet (20 mg total) by mouth daily. 09/22/20   Myrlene Broker, MD  ?promethazine-dextromethorphan (PROMETHAZINE-DM) 6.25-15 MG/5ML syrup Take 5 mLs by mouth 4 (four) times daily as needed for cough. 07/01/21   Terressa Koyanagi, DO  ?traMADol (ULTRAM) 50 MG tablet TAKE 1 TABLET (50MG ) BY MOUTH EVERY 6 HOURS AS NEEDED. 02/26/21   02/28/21, MD  ? ? ?Family History ?Family History  ?Problem Relation Age of Onset  ? Diabetes Mother   ? Hyperlipidemia Mother   ? Cancer Father   ?     prostate  ? Diabetes Sister   ? Hyperlipidemia Sister   ? Hypertension Sister   ? Colon cancer Neg Hx   ? Breast cancer Neg Hx   ? ? ?Social History ?Social History  ? ?Tobacco Use  ? Smoking status: Never  ? Smokeless tobacco: Never  ?Substance Use Topics  ? Alcohol use: Yes  ?  Alcohol/week: 2.0 standard drinks  ?  Types: 2 Glasses of wine per week  ? Drug use: No  ? ? ? ?  Allergies   ?Patient has no known allergies. ? ? ?Review of Systems ?Review of Systems  ?Constitutional:  Negative for chills and fever.  ?HENT:  Positive for congestion, sinus pressure and sore throat. Negative for ear pain.   ?Eyes:  Negative for discharge and redness.  ?Respiratory:  Negative for cough, shortness of breath and wheezing.   ?Gastrointestinal:  Negative for abdominal pain, diarrhea, nausea and vomiting.  ? ? ?Physical Exam ?Triage Vital Signs ?ED Triage Vitals  ?Enc Vitals Group  ?   BP   ?   Pulse   ?   Resp   ?   Temp   ?   Temp src   ?   SpO2   ?   Weight   ?   Height   ?   Head Circumference   ?   Peak Flow   ?   Pain Score   ?   Pain Loc   ?   Pain Edu?   ?   Excl. in GC?   ? ?No data found. ? ?Updated Vital Signs ?BP 119/81 (BP Location: Left Arm)    Pulse (!) 104   Temp 98.9 ?F (37.2 ?C) (Oral)   Resp 18   SpO2 96%  ?  ? ?Physical Exam ?Vitals and nursing note reviewed.  ?Constitutional:   ?   General: She is not in acute distress. ?   Appearance: Normal appearance. She is not ill-appearing.  ?HENT:  ?   Head: Normocephalic and atraumatic.  ?   Nose: Congestion present.  ?   Mouth/Throat:  ?   Mouth: Mucous membranes are moist.  ?   Pharynx: No oropharyngeal exudate or posterior oropharyngeal erythema.  ?Eyes:  ?   Conjunctiva/sclera: Conjunctivae normal.  ?Cardiovascular:  ?   Rate and Rhythm: Normal rate.  ?Pulmonary:  ?   Effort: Pulmonary effort is normal. No respiratory distress.  ?Skin: ?   General: Skin is warm and dry.  ?Neurological:  ?   Mental Status: She is alert.  ?Psychiatric:     ?   Mood and Affect: Mood normal.     ?   Thought Content: Thought content normal.  ? ? ? ?UC Treatments / Results  ?Labs ?(all labs ordered are listed, but only abnormal results are displayed) ?Labs Reviewed - No data to display ? ?EKG ? ? ?Radiology ?No results found. ? ?Procedures ?Procedures (including critical care time) ? ?Medications Ordered in UC ?Medications - No data to display ? ?Initial Impression / Assessment and Plan / UC Course  ?I have reviewed the triage vital signs and the nursing notes. ? ?Pertinent labs & imaging results that were available during my care of the patient were reviewed by me and considered in my medical decision making (see chart for details). ? ?  ?Suspect sinusitis given waxing waning symptoms.  Will treat with antibiotics and recommended follow-up if symptoms fail to improve or worsen.  Recommended she continue her allergy treatment. ? ?Final Clinical Impressions(s) / UC Diagnoses  ? ?Final diagnoses:  ?Acute sinusitis, recurrence not specified, unspecified location  ? ?Discharge Instructions   ?None ?  ? ?ED Prescriptions   ? ? Medication Sig Dispense Auth. Provider  ? amoxicillin-clavulanate (AUGMENTIN) 875-125 MG tablet Take 1  tablet by mouth every 12 (twelve) hours. 14 tablet Tomi Bamberger, PA-C  ? ?  ? ?PDMP not reviewed this encounter. ?  ?Tomi Bamberger, PA-C ?12/04/21 0932 ? ?

## 2021-12-05 ENCOUNTER — Telehealth: Payer: Self-pay | Admitting: Emergency Medicine

## 2021-12-05 NOTE — Telephone Encounter (Signed)
Spoke with pt sts still having itching the same as yesterday; pt sts she took zyrtec and benadryl without relief; per RM no other meds advised and advised to get further eval in ED if needed; pt sts that she was given promethazine cough syrup in past for same; encouraged pt to follow up with PCP who prescribed in past to obtain further RX; pt verbalized understanding  ?

## 2021-12-07 ENCOUNTER — Telehealth (INDEPENDENT_AMBULATORY_CARE_PROVIDER_SITE_OTHER): Payer: BC Managed Care – PPO | Admitting: Internal Medicine

## 2021-12-07 ENCOUNTER — Encounter: Payer: Self-pay | Admitting: Internal Medicine

## 2021-12-07 ENCOUNTER — Other Ambulatory Visit: Payer: Self-pay

## 2021-12-07 DIAGNOSIS — J3489 Other specified disorders of nose and nasal sinuses: Secondary | ICD-10-CM | POA: Insufficient documentation

## 2021-12-07 MED ORDER — PROMETHAZINE-DM 6.25-15 MG/5ML PO SYRP
5.0000 mL | ORAL_SOLUTION | Freq: Four times a day (QID) | ORAL | 0 refills | Status: DC | PRN
Start: 2021-12-07 — End: 2022-03-29

## 2021-12-07 MED ORDER — PREDNISONE 20 MG PO TABS: 40.0000 mg | ORAL_TABLET | Freq: Every day | ORAL | 0 refills | Status: DC

## 2021-12-07 MED ORDER — PAROXETINE HCL 10 MG PO TABS: 10.0000 mg | ORAL_TABLET | Freq: Every morning | ORAL | 3 refills | Status: DC

## 2021-12-07 MED ORDER — LIDOCAINE VISCOUS HCL 2 % MT SOLN: 10.0000 mL | OROMUCOSAL | 0 refills | Status: AC | PRN

## 2021-12-07 NOTE — Progress Notes (Signed)
Virtual Visit via Video Note ? ?I connected with Sally Kidd on 12/07/21 at  3:20 PM EST by a video enabled telemedicine application and verified that I am speaking with the correct person using two identifiers. ? ?The patient and the provider were at separate locations throughout the entire encounter. Patient location: home, Provider location: work ?  ?I discussed the limitations of evaluation and management by telemedicine and the availability of in person appointments. The patient expressed understanding and agreed to proceed. The patient and the provider were the only parties present for the visit unless noted in HPI below. ? ?History of Present Illness: The patient is a 56 y.o. female with visit for sinus problems.  ? ?Observations/Objective: Appearance: normal, breathing appears normal, no coughing during visit, pain to self palpation sinuses, mental status is A and O times 3 ? ?Assessment and Plan: See problem oriented charting ? ?Follow Up Instructions: rx prednisone, lidocaine viscous, promethazine/dm ? ?I discussed the assessment and treatment plan with the patient. The patient was provided an opportunity to ask questions and all were answered. The patient agreed with the plan and demonstrated an understanding of the instructions. ?  ?The patient was advised to call back or seek an in-person evaluation if the symptoms worsen or if the condition fails to improve as anticipated. ? ?Hoyt Koch, MD ? ?

## 2021-12-07 NOTE — Assessment & Plan Note (Signed)
She is taking augmentin from urgent care. Rx prednisone for the severe pain and lidocaine viscous for throat pain/itching. Okay to continue zyrtec otc and rx promethazine/dm for cough as she is having some at night time from drainage.  ?

## 2022-03-02 ENCOUNTER — Other Ambulatory Visit: Payer: Self-pay | Admitting: Internal Medicine

## 2022-03-22 NOTE — Progress Notes (Unsigned)
Virtual Visit via Video Note  I connected with Sally Kidd on 03/22/22 at 10:00 AM EDT by a video enabled telemedicine application and verified that I am speaking with the correct person using two identifiers.   I discussed the limitations of evaluation and management by telemedicine and the availability of in person appointments. The patient expressed understanding and agreed to proceed.  Present for the visit:  Myself, Dr Cheryll Cockayne, Sally Kidd.  The patient is currently at home and I am in the office.    No referring provider.    History of Present Illness: This is an acute visit for itchy eye, sinus,     ROS    Social History   Socioeconomic History   Marital status: Married    Spouse name: Not on file   Number of children: Not on file   Years of education: Not on file   Highest education level: Not on file  Occupational History   Not on file  Tobacco Use   Smoking status: Never   Smokeless tobacco: Never  Substance and Sexual Activity   Alcohol use: Yes    Alcohol/week: 2.0 standard drinks of alcohol    Types: 2 Glasses of wine per week   Drug use: No   Sexual activity: Not on file  Other Topics Concern   Not on file  Social History Narrative   Not on file   Social Determinants of Health   Financial Resource Strain: Not on file  Food Insecurity: Not on file  Transportation Needs: Not on file  Physical Activity: Not on file  Stress: Not on file  Social Connections: Not on file     Observations/Objective: Appears well in NAD   Assessment and Plan:  See Problem List for Assessment and Plan of chronic medical problems.   Follow Up Instructions:    I discussed the assessment and treatment plan with the patient. The patient was provided an opportunity to ask questions and all were answered. The patient agreed with the plan and demonstrated an understanding of the instructions.   The patient was advised to call back or seek an in-person  evaluation if the symptoms worsen or if the condition fails to improve as anticipated.    Pincus Sanes, MD

## 2022-03-23 ENCOUNTER — Encounter: Payer: Self-pay | Admitting: Internal Medicine

## 2022-03-23 ENCOUNTER — Telehealth: Payer: BC Managed Care – PPO | Admitting: Internal Medicine

## 2022-03-29 ENCOUNTER — Ambulatory Visit: Payer: BC Managed Care – PPO | Admitting: Internal Medicine

## 2022-03-29 ENCOUNTER — Encounter: Payer: Self-pay | Admitting: Internal Medicine

## 2022-03-29 DIAGNOSIS — J3489 Other specified disorders of nose and nasal sinuses: Secondary | ICD-10-CM | POA: Diagnosis not present

## 2022-03-29 MED ORDER — PREDNISONE 20 MG PO TABS: 40.0000 mg | ORAL_TABLET | Freq: Every day | ORAL | 0 refills | Status: DC

## 2022-03-29 MED ORDER — PROMETHAZINE-DM 6.25-15 MG/5ML PO SYRP
5.0000 mL | ORAL_SOLUTION | Freq: Four times a day (QID) | ORAL | 0 refills | Status: DC | PRN
Start: 2022-03-29 — End: 2022-05-04

## 2022-03-29 MED ORDER — OLOPATADINE HCL 0.2 % OP SOLN: 2.0000 [drp] | Freq: Two times a day (BID) | OPHTHALMIC | 11 refills | Status: AC | PRN

## 2022-03-29 NOTE — Progress Notes (Signed)
   Subjective:   Patient ID: Sally Kidd, female    DOB: Jun 20, 1966, 56 y.o.   MRN: [id number]  HPI The patient is a 56 YO female coming in for sinus concerns.   Review of Systems  Constitutional:  Positive for activity change and appetite change. Negative for chills, fatigue, fever and unexpected weight change.  HENT:  Positive for congestion, postnasal drip, rhinorrhea and sinus pressure. Negative for ear discharge, ear pain, sinus pain, sneezing, sore throat, tinnitus, trouble swallowing and voice change.   Eyes: Negative.   Respiratory:  Positive for cough. Negative for chest tightness, shortness of breath and wheezing.   Cardiovascular: Negative.   Gastrointestinal: Negative.   Musculoskeletal:  Positive for myalgias.  Neurological: Negative.     Objective:  Physical Exam Constitutional:      Appearance: She is well-developed.  HENT:     Head: Normocephalic and atraumatic.     Comments: Oropharynx with redness and clear drainage, nose with swollen turbinates, TMs normal bilaterally.  Neck:     Thyroid: No thyromegaly.  Cardiovascular:     Rate and Rhythm: Normal rate and regular rhythm.  Pulmonary:     Effort: Pulmonary effort is normal. No respiratory distress.     Breath sounds: Normal breath sounds. No wheezing or rales.  Abdominal:     Palpations: Abdomen is soft.  Musculoskeletal:        General: Tenderness present.     Cervical back: Normal range of motion.  Lymphadenopathy:     Cervical: No cervical adenopathy.  Skin:    General: Skin is warm and dry.  Neurological:     Mental Status: She is alert and oriented to person, place, and time.     Vitals:   03/29/22 1528  BP: 118/70  Pulse: 88  Resp: 18  SpO2: 98%  Weight: 167 lb 9.6 oz (76 kg)  Height: 5\' 7"  (1.702 m)    Assessment & Plan:

## 2022-04-01 NOTE — Assessment & Plan Note (Signed)
No indication for antibiotics today. Rx prednisone and pataday eye drops and promethazine/dm. Advised to continue using singulair and zyrtec.

## 2022-04-13 ENCOUNTER — Other Ambulatory Visit: Payer: Self-pay | Admitting: Internal Medicine

## 2022-04-13 DIAGNOSIS — Z1231 Encounter for screening mammogram for malignant neoplasm of breast: Secondary | ICD-10-CM

## 2022-04-26 ENCOUNTER — Other Ambulatory Visit: Payer: Self-pay | Admitting: Internal Medicine

## 2022-05-04 ENCOUNTER — Encounter: Payer: Self-pay | Admitting: Family Medicine

## 2022-05-04 ENCOUNTER — Telehealth (INDEPENDENT_AMBULATORY_CARE_PROVIDER_SITE_OTHER): Payer: BC Managed Care – PPO | Admitting: Family Medicine

## 2022-05-04 DIAGNOSIS — J3089 Other allergic rhinitis: Secondary | ICD-10-CM | POA: Diagnosis not present

## 2022-05-04 DIAGNOSIS — Z9109 Other allergy status, other than to drugs and biological substances: Secondary | ICD-10-CM

## 2022-05-04 MED ORDER — MONTELUKAST SODIUM 10 MG PO TABS: 10.0000 mg | ORAL_TABLET | Freq: Every day | ORAL | 1 refills | Status: DC

## 2022-05-04 MED ORDER — IBUPROFEN 600 MG PO TABS: 600.0000 mg | ORAL_TABLET | Freq: Three times a day (TID) | ORAL | 0 refills | Status: AC | PRN

## 2022-05-04 NOTE — Assessment & Plan Note (Signed)
Continue current therapies and restart Singulair. Referral to allergist.

## 2022-05-04 NOTE — Assessment & Plan Note (Addendum)
Worsening allergies. Oral steroids in June.  Convinced her building at work is affecting allergies. Continue current therapies and restart Singulair. Referral to allergist.

## 2022-05-04 NOTE — Progress Notes (Signed)
MyChart Video Visit    Virtual Visit via Video Note   This visit type was conducted due to national recommendations for restrictions regarding the COVID-19 Pandemic (e.g. social distancing) in an effort to limit this patient's exposure and mitigate transmission in our community. This patient is at least at moderate risk for complications without adequate follow up. This format is felt to be most appropriate for this patient at this time. Physical exam was limited by quality of the video and audio technology used for the visit. CMA was able to get the patient set up on a video visit.  Patient location: Home. Patient and provider in visit Provider location: Office  I discussed the limitations of evaluation and management by telemedicine and the availability of in person appointments. The patient expressed understanding and agreed to proceed.  Visit Date: 05/04/2022  Today's healthcare provider: Hetty Blend, NP-C     Subjective:    Patient ID: Sally Kidd, female    DOB: [birth year], 56 y.o.   MRN: [id number]  Chief Complaint  Patient presents with   itching eyes    Itching, watery eyes and itchy throat since last week states she knows she has a sinus infection. This has been going on and off for years now. Has been using lidocaine to rinse her mouth out which has given some relief.     HPI  Complains of a one week history of itching, watery eyes, and a sore and itchy top of her mouth. No sore throat.   Hx of allergies. Severe since moving here.   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  She has been out of the building since June 2023.   Taking Zyrtec, Benadryl, Pataday and Neti pot States she has been out of Singulair.   Denies fever, chills, dizziness, headache, chest pain, palpitations, shortness of breath, abdominal pain, N/V/D.      Past Medical History:  Diagnosis Date   Back pain    back injury due to fall in  2013   Constipation    DDD (degenerative disc disease), lumbar    Hemorrhoids     Past Surgical History:  Procedure Laterality Date   ABDOMINAL HYSTERECTOMY     BREAST BIOPSY     Bil/ benign    Family History  Problem Relation Age of Onset   Diabetes Mother    Hyperlipidemia Mother    Cancer Father        prostate   Diabetes Sister    Hyperlipidemia Sister    Hypertension Sister    Colon cancer Neg Hx    Breast cancer Neg Hx     Social History   Socioeconomic History   Marital status: Married    Spouse name: Not on file   Number of children: Not on file   Years of education: Not on file   Highest education level: Not on file  Occupational History   Not on file  Tobacco Use   Smoking status: Never   Smokeless tobacco: Never  Substance and Sexual Activity   Alcohol use: Yes    Alcohol/week: 2.0 standard drinks of alcohol    Types: 2 Glasses of wine per week   Drug use: No   Sexual activity: Not on file  Other Topics Concern   Not on file  Social History Narrative   Not on file   Social Determinants of Health   Financial Resource Strain: Not on file  Food Insecurity: Not on file  Transportation Needs: Not on file  Physical Activity: Not on file  Stress: Not on file  Social Connections: Not on file  Intimate Partner Violence: Not on file    Outpatient Medications Prior to Visit  Medication Sig Dispense Refill   cetirizine (ZYRTEC) 10 MG tablet Take 20 mg by mouth as needed. Reported on 04/04/2016     diphenhydrAMINE (BENADRYL) 25 MG tablet Take 25 mg by mouth every 6 (six) hours as needed. Reported on 04/04/2016     lidocaine (XYLOCAINE) 2 % solution Use as directed 10 mLs in the mouth or throat every 4 (four) hours as needed for mouth pain. 100 mL 0   Misc Natural Products (COLON CLEANSER PO) Take 3 capsules by mouth once a week.     Olopatadine HCl 0.2 % SOLN Apply 2 drops to eye 2 (two) times daily as needed (allergies, itching eye). 5 mL 11   PARoxetine  (PAXIL) 10 MG tablet Take 1 tablet (10 mg total) by mouth every morning. 90 tablet 3   pravastatin (PRAVACHOL) 20 MG tablet TAKE 1 TABLET ONCE DAILY. 90 tablet 1   traMADol (ULTRAM) 50 MG tablet TAKE 1 TABLET (50MG ) BY MOUTH EVERY 6 HOURS AS NEEDED. 100 tablet 0   montelukast (SINGULAIR) 10 MG tablet Take 1 tablet (10 mg total) by mouth at bedtime. 30 tablet 0   predniSONE (DELTASONE) 20 MG tablet Take 2 tablets (40 mg total) by mouth daily with breakfast. 10 tablet 0   promethazine-dextromethorphan (PROMETHAZINE-DM) 6.25-15 MG/5ML syrup Take 5 mLs by mouth 4 (four) times daily as needed for cough. 118 mL 0   No facility-administered medications prior to visit.    No Known Allergies  ROS     Objective:    Physical Exam  There were no vitals taken for this visit. Wt Readings from Last 3 Encounters:  03/29/22 167 lb 9.6 oz (76 kg)  12/08/20 172 lb 12.8 oz (78.4 kg)  09/04/20 175 lb (79.4 kg)   Alert and oriented and in no acute distress. Respirations unlabored.     Assessment & Plan:   Problem List Items Addressed This Visit       Respiratory   Non-seasonal allergic rhinitis    Continue current therapies and restart Singulair. Referral to allergist.       Relevant Orders   Ambulatory referral to Allergy     Other   Environmental allergies - Primary    Worsening allergies. Oral steroids in June.  Convinced her building at work is affecting allergies. Continue current therapies and restart Singulair. Referral to allergist.       Relevant Medications   montelukast (SINGULAIR) 10 MG tablet   ibuprofen (ADVIL) 600 MG tablet   Other Relevant Orders   Ambulatory referral to Allergy    I have discontinued July Strawderman's predniSONE and promethazine-dextromethorphan. I am also having her start on ibuprofen. Additionally, I am having her maintain her cetirizine, Misc Natural Products (COLON CLEANSER PO), diphenhydrAMINE, traMADol, PARoxetine, lidocaine, pravastatin,  Olopatadine HCl, and montelukast.  Meds ordered this encounter  Medications   montelukast (SINGULAIR) 10 MG tablet    Sig: Take 1 tablet (10 mg total) by mouth at bedtime.    Dispense:  30 tablet    Refill:  1    Order Specific Question:   Supervising Provider    Answer:   Cala Bradford A [[id number]]   ibuprofen (ADVIL) 600 MG tablet    Sig: Take 1 tablet (600  mg total) by mouth every 8 (eight) hours as needed.    Dispense:  30 tablet    Refill:  0    Order Specific Question:   Supervising Provider    Answer:   Hillard Danker A [[id number]]    I discussed the assessment and treatment plan with the patient. The patient was provided an opportunity to ask questions and all were answered. The patient agreed with the plan and demonstrated an understanding of the instructions.   The patient was advised to call back or seek an in-person evaluation if the symptoms worsen or if the condition fails to improve as anticipated.  I provided 17 minutes of face-to-face time during this encounter.   Hetty Blend, NP-C [facility] [place] [phone] (phone) [phone] (fax)  [facility]

## 2022-05-12 ENCOUNTER — Ambulatory Visit
Admission: RE | Admit: 2022-05-12 | Discharge: 2022-05-12 | Disposition: A | Discharge status: Home / Self Care | Payer: BC Managed Care – PPO | Source: Ambulatory Visit | Attending: Internal Medicine | Admitting: Internal Medicine

## 2022-05-12 DIAGNOSIS — Z1231 Encounter for screening mammogram for malignant neoplasm of breast: Secondary | ICD-10-CM

## 2022-05-20 ENCOUNTER — Telehealth: Payer: Self-pay | Admitting: Internal Medicine

## 2022-05-20 ENCOUNTER — Telehealth: Payer: Self-pay

## 2022-05-20 DIAGNOSIS — J329 Chronic sinusitis, unspecified: Secondary | ICD-10-CM

## 2022-05-20 NOTE — Telephone Encounter (Signed)
Pt is requesting a referral for ENT for recurring sinus infections.  Please advise

## 2022-05-20 NOTE — Telephone Encounter (Signed)
Referral done

## 2022-05-20 NOTE — Telephone Encounter (Signed)
Long term tessalon perles are safer for cough we can send this in for her.

## 2022-05-20 NOTE — Telephone Encounter (Signed)
Pt states she has been seen multiple times this year for a recurring sinus infection. She said we prescribed her promethazine-dextromethorphan 6.25-15 MG/5ML syrup for her cough. She would like to know if she can get a refill on this rx because the cough has come back.   Please advise.

## 2022-05-22 ENCOUNTER — Other Ambulatory Visit: Payer: Self-pay | Admitting: Internal Medicine

## 2022-05-26 NOTE — Telephone Encounter (Signed)
Pt reports that its more of a burning itchy throat than a cough. Pt says if the tessalon perles will help then she is will to try that.  Pharmacy: [facility] [place], [place] - [address]  LOV 03/29/22

## 2022-05-27 NOTE — Telephone Encounter (Signed)
Otc cough drops can help with throat irritation or warm tea with honey.

## 2022-05-31 NOTE — Telephone Encounter (Signed)
LVM for pt of Dr. Frutoso Chase suggestions.

## 2022-06-07 NOTE — Telephone Encounter (Signed)
The original prescription was discontinued on 05/04/2022 by Marinus Maw, CMA. Pls advise on refill.Marland KitchenRaechel Chute

## 2022-06-21 IMAGING — MG MM DIGITAL SCREENING BILAT W/ TOMO AND CAD
6 of 10 series · 6 of 30 positions shown · non-contrast
Comparison: Previous exam(s).

CLINICAL DATA: Screening.

EXAM:
DIGITAL SCREENING BILATERAL MAMMOGRAM WITH TOMOSYNTHESIS AND CAD
TECHNIQUE: Bilateral screening digital craniocaudal and mediolateral oblique
mammograms were obtained. Bilateral screening digital breast
tomosynthesis was performed. The images were evaluated with
computer-aided detection.

[L MLO synth-2D (1 of 2)]
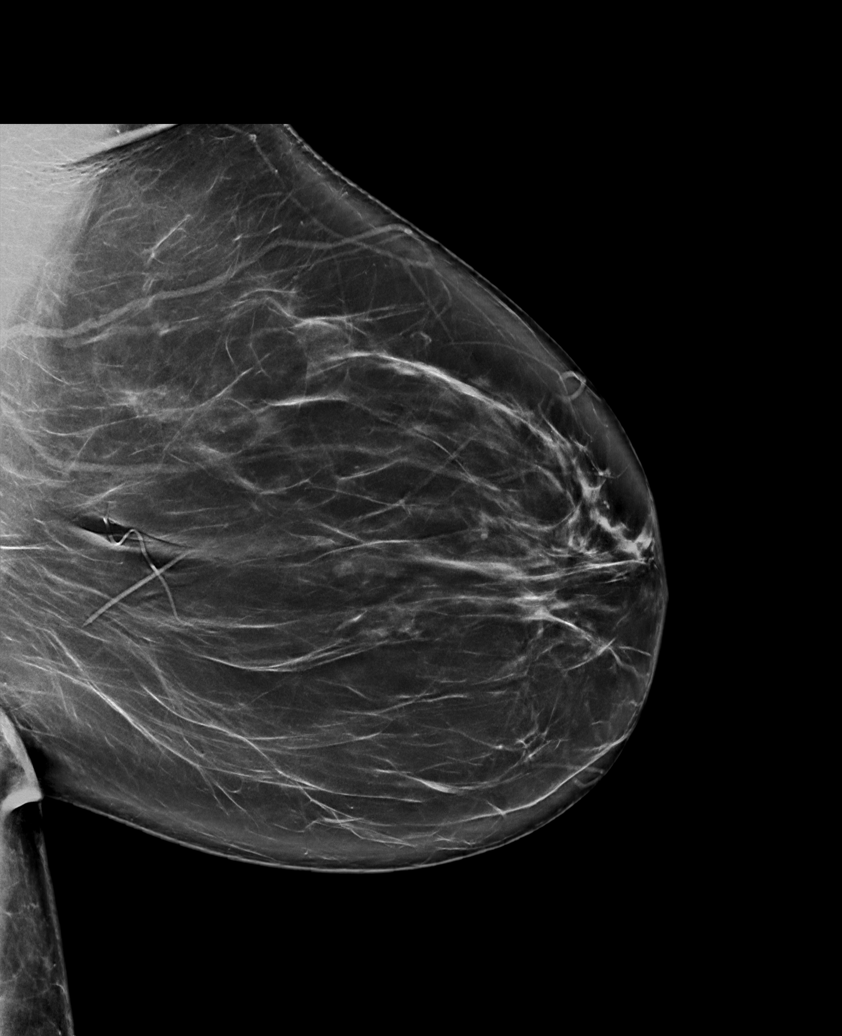

[L CC synth-2D]
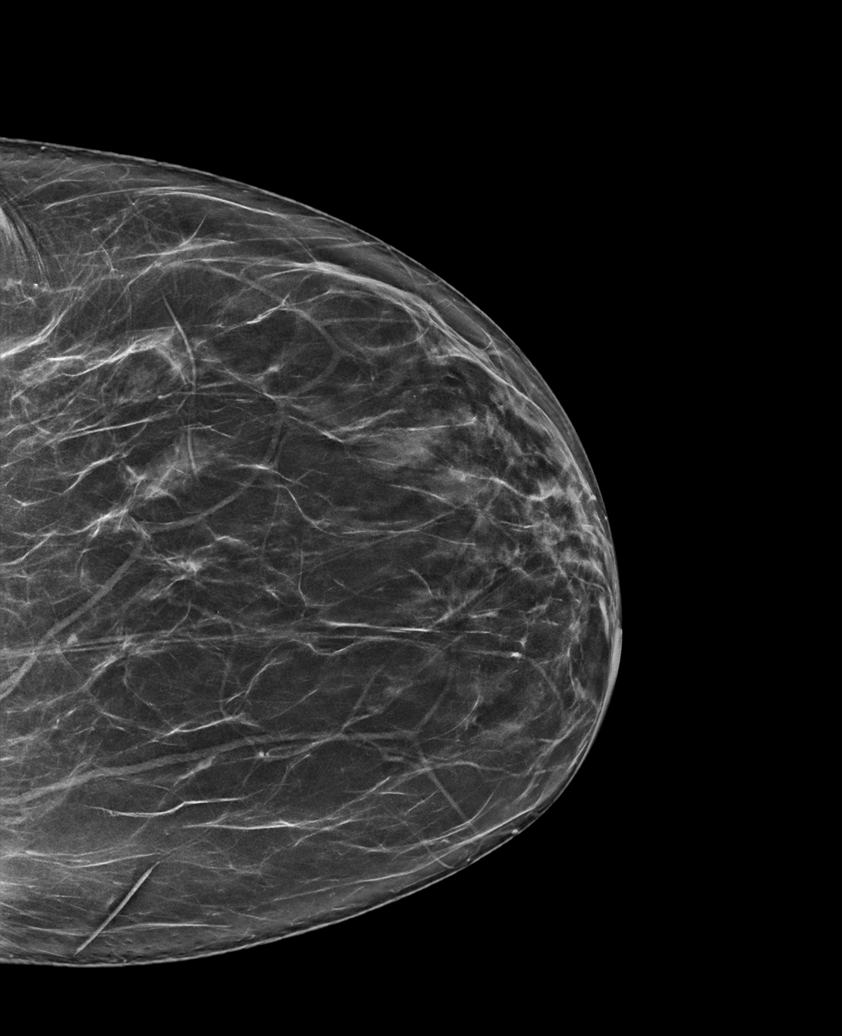

[L MLO synth-2D (2 of 2)]
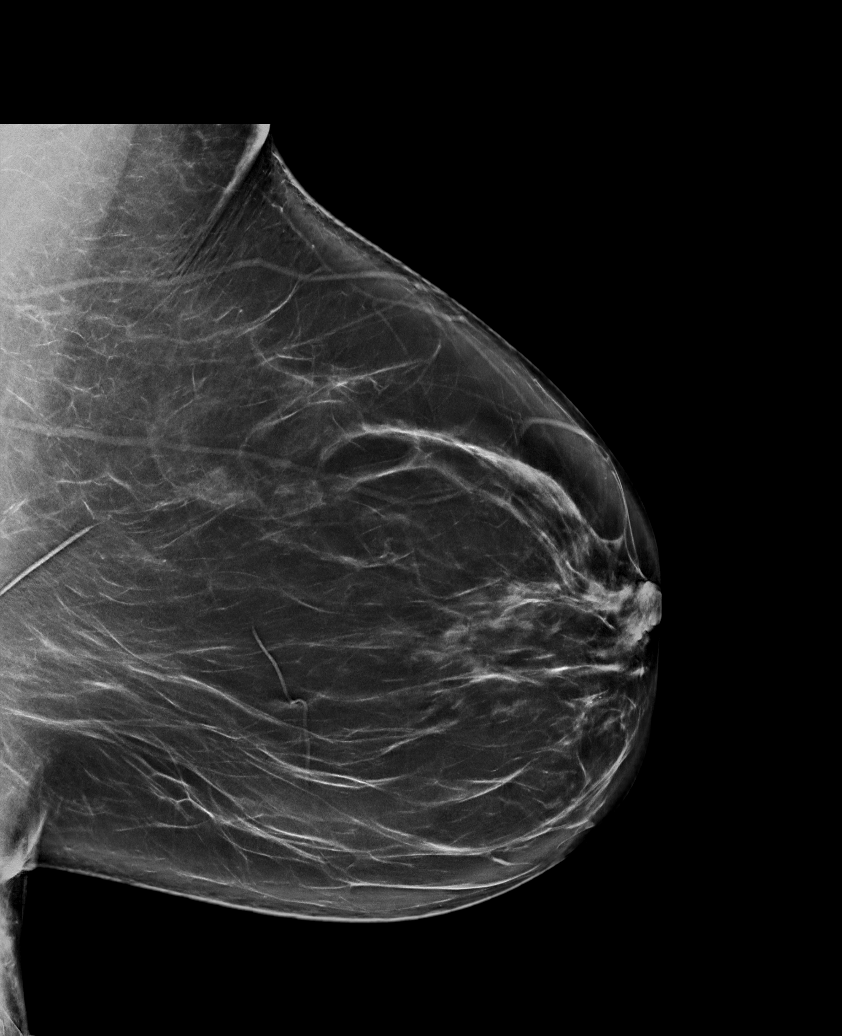

[R CC synth-2D]
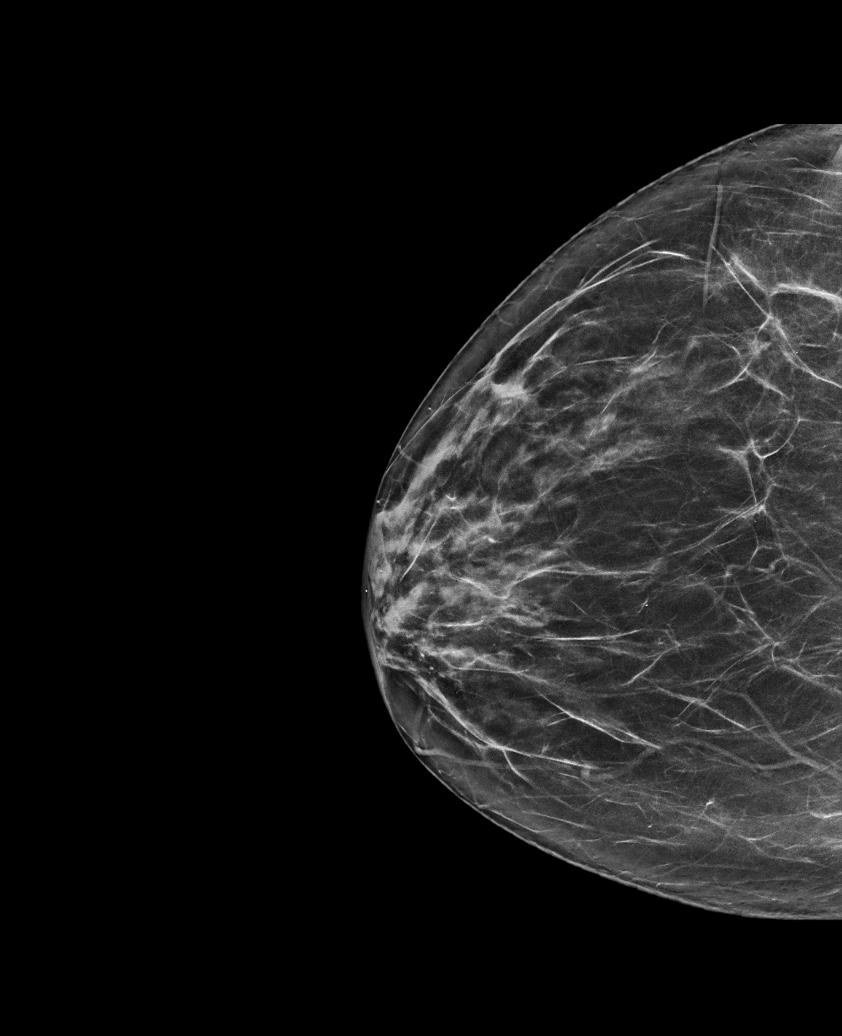

[R MLO synth-2D]
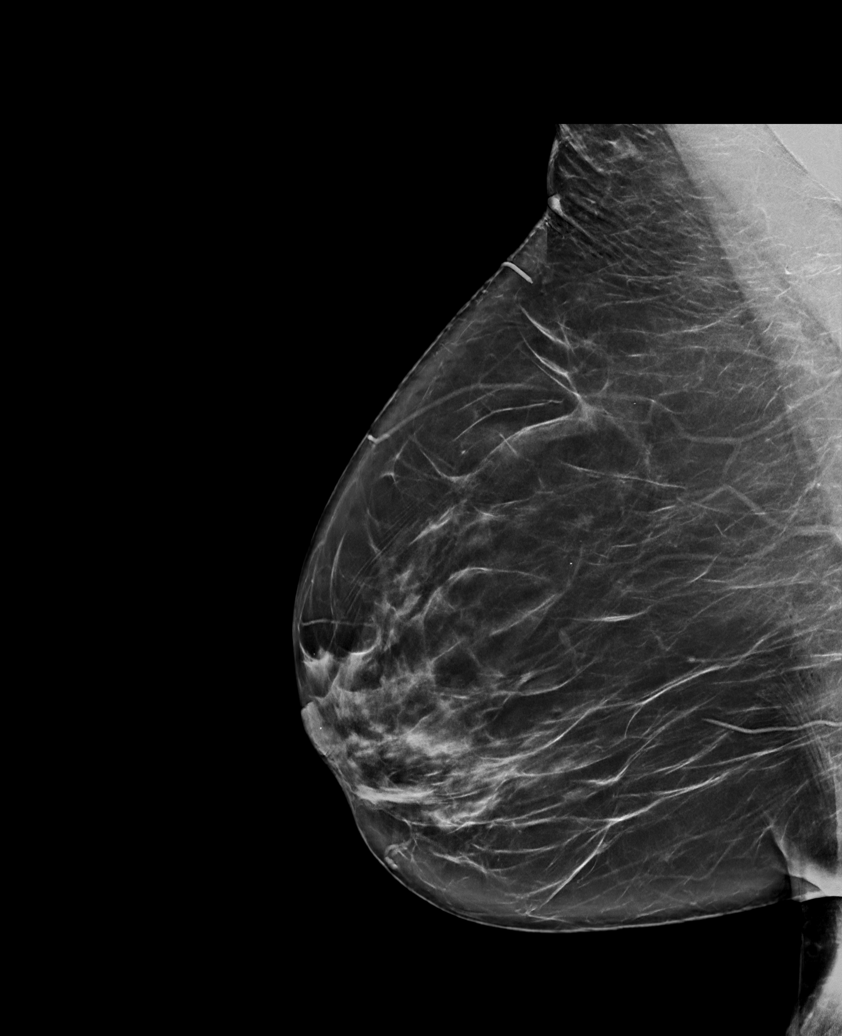

[L MLO tomo · tomo slice 45/88.0]
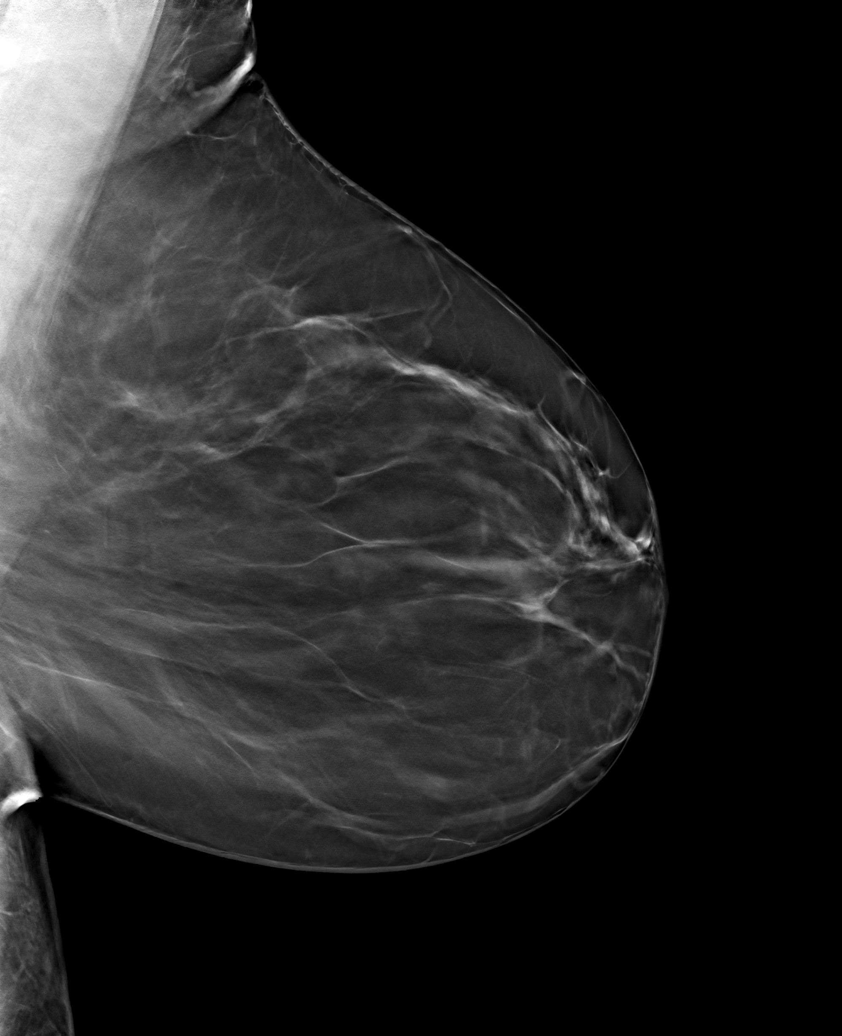

[6 of 30 positions shown; findings below may reference images not displayed]

ACR Breast Density Category b: There are scattered areas of
fibroglandular density.
FINDINGS: There are no findings suspicious for malignancy.
IMPRESSION: No mammographic evidence of malignancy. A result letter of this
screening mammogram will be mailed directly to the patient.

RECOMMENDATION:
Screening mammogram in one year. (Code:51-O-LD2)

BI-RADS CATEGORY  1: Negative.

## 2022-08-12 ENCOUNTER — Encounter: Payer: Self-pay | Admitting: Family Medicine

## 2022-08-12 ENCOUNTER — Other Ambulatory Visit: Payer: Self-pay

## 2022-08-12 ENCOUNTER — Emergency Department (HOSPITAL_COMMUNITY)
Admission: EM | Admit: 2022-08-12 | Discharge: 2022-08-13 | Discharge status: Against Medical Advice | Payer: BC Managed Care – PPO | Attending: Emergency Medicine | Admitting: Emergency Medicine

## 2022-08-12 ENCOUNTER — Ambulatory Visit: Payer: BC Managed Care – PPO | Admitting: Family Medicine

## 2022-08-12 ENCOUNTER — Emergency Department (HOSPITAL_COMMUNITY): Payer: BC Managed Care – PPO

## 2022-08-12 ENCOUNTER — Encounter (HOSPITAL_COMMUNITY): Payer: Self-pay | Admitting: Emergency Medicine

## 2022-08-12 VITALS — BP 126/84 | HR 85 | Temp 97.6°F | Ht 67.0 in | Wt 161.0 lb

## 2022-08-12 DIAGNOSIS — M549 Dorsalgia, unspecified: Secondary | ICD-10-CM | POA: Insufficient documentation

## 2022-08-12 DIAGNOSIS — R0789 Other chest pain: Secondary | ICD-10-CM | POA: Diagnosis present

## 2022-08-12 DIAGNOSIS — R0602 Shortness of breath: Secondary | ICD-10-CM | POA: Diagnosis not present

## 2022-08-12 DIAGNOSIS — R5383 Other fatigue: Secondary | ICD-10-CM

## 2022-08-12 DIAGNOSIS — Z5321 Procedure and treatment not carried out due to patient leaving prior to being seen by health care provider: Secondary | ICD-10-CM | POA: Diagnosis not present

## 2022-08-12 DIAGNOSIS — Z23 Encounter for immunization: Secondary | ICD-10-CM | POA: Diagnosis not present

## 2022-08-12 DIAGNOSIS — R079 Chest pain, unspecified: Secondary | ICD-10-CM | POA: Diagnosis not present

## 2022-08-12 DIAGNOSIS — R9431 Abnormal electrocardiogram [ECG] [EKG]: Secondary | ICD-10-CM

## 2022-08-12 LAB — BASIC METABOLIC PANEL
Anion gap: 7 (ref 5–15)
BUN: 12 mg/dL (ref 6–20)
CO2: 25 mmol/L (ref 22–32)
Calcium: 9.2 mg/dL (ref 8.9–10.3)
Chloride: 109 mmol/L (ref 98–111)
Creatinine, Ser: 0.86 mg/dL (ref 0.44–1.00)
GFR, Estimated: >60 mL/min (ref >60)
Glucose, Bld: 93 mg/dL (ref 70–99)
Potassium: 3.8 mmol/L (ref 3.5–5.1)
Sodium: 141 mmol/L (ref 135–145)

## 2022-08-12 LAB — CBC WITH DIFFERENTIAL/PLATELET
Basophils Absolute: 0.1 10*3/uL (ref 0.0–0.1)
Basophils Relative: 0.9 % (ref 0.0–3.0)
Eosinophils Absolute: 0.1 10*3/uL (ref 0.0–0.7)
Eosinophils Relative: 2.1 % (ref 0.0–5.0)
HCT: 39.4 % (ref 36.0–46.0)
Hemoglobin: 13.3 g/dL (ref 12.0–15.0)
Lymphocytes Relative: 40.4 % (ref 12.0–46.0)
Lymphs Abs: 2.7 10*3/uL (ref 0.7–4.0)
MCHC: 33.9 g/dL (ref 30.0–36.0)
MCV: 84.2 fl (ref 78.0–100.0)
Monocytes Absolute: 0.4 10*3/uL (ref 0.1–1.0)
Monocytes Relative: 5.6 % (ref 3.0–12.0)
Neutro Abs: 3.4 10*3/uL (ref 1.4–7.7)
Neutrophils Relative %: 51 % (ref 43.0–77.0)
Platelets: 273 10*3/uL (ref 150.0–400.0)
RBC: 4.68 Mil/uL (ref 3.87–5.11)
RDW: 13 % (ref 11.5–15.5)
WBC: 6.7 10*3/uL (ref 4.0–10.5)

## 2022-08-12 LAB — CBC
HCT: 37.3 % (ref 36.0–46.0)
Hemoglobin: 13 g/dL (ref 12.0–15.0)
MCH: 28.9 pg (ref 26.0–34.0)
MCHC: 34.9 g/dL (ref 30.0–36.0)
MCV: 82.9 fL (ref 80.0–100.0)
Platelets: 285 10*3/uL (ref 150–400)
RBC: 4.5 MIL/uL (ref 3.87–5.11)
RDW: 12.4 % (ref 11.5–15.5)
WBC: 7.5 10*3/uL (ref 4.0–10.5)
nRBC: 0 % (ref 0.0–0.2)

## 2022-08-12 LAB — COMPREHENSIVE METABOLIC PANEL
ALT: 12 U/L (ref 0–35)
AST: 17 U/L (ref 0–37)
Albumin: 4.5 g/dL (ref 3.5–5.2)
Alkaline Phosphatase: 58 U/L (ref 39–117)
BUN: 8 mg/dL (ref 6–23)
CO2: 28 mEq/L (ref 19–32)
Calcium: 9.6 mg/dL (ref 8.4–10.5)
Chloride: 105 mEq/L (ref 96–112)
Creatinine, Ser: 0.93 mg/dL (ref 0.40–1.20)
GFR: 68.26 mL/min (ref >60.00)
Glucose, Bld: 147 mg/dL — ABNORMAL HIGH (ref 70–99)
Potassium: 3.6 mEq/L (ref 3.5–5.1)
Sodium: 139 mEq/L (ref 135–145)
Total Bilirubin: 0.5 mg/dL (ref 0.2–1.2)
Total Protein: 7.4 g/dL (ref 6.0–8.3)

## 2022-08-12 LAB — LIPASE: Lipase: 14 U/L (ref 11.0–59.0)

## 2022-08-12 LAB — TROPONIN I (HIGH SENSITIVITY)
High Sens Troponin I: <2 ng/L (ref 2–17)
Troponin I (High Sensitivity): <2 ng/L (ref <18)

## 2022-08-12 MED ORDER — ALUM & MAG HYDROXIDE-SIMETH 200-200-20 MG/5ML PO SUSP
30.0000 mL | Freq: Once | ORAL | Status: AC
  Administered 2022-08-12: 30 mL via ORAL
  Filled 2022-08-12: qty 30

## 2022-08-12 NOTE — Progress Notes (Unsigned)
Subjective:     Patient ID: Sally Kidd, female    DOB: [birth year], 56 y.o.   MRN: [id number]  Chief Complaint  Patient presents with   chest pressure    X2 weeks on and off, states it feels like there is a trapped gas bubble in her chest going striaght through to her back, she took Mylanta and it seemed to help some and is easing out    HPI Patient is in today for chest pain that radiated to her upper back last week and resolved 2 days ago. States she thinks she had a "trapped gas pain" in her chest that was relieved with Mylanta. No pain since. Denies chest pain today.  She had associated shortness of breath with her chest pain but none in the past 2 days.   She has noticed increased gas when she eats.    Takes Tramadol for back pain.   Denies fever, chills, dizziness, palpitations, abdominal pain, N/V/D, urinary symptoms, LE edema.    Health Maintenance Due  Topic Date Due   HIV Screening  Never done   Hepatitis C Screening  Never done    Past Medical History:  Diagnosis Date   Back pain    back injury due to fall in 2013   Constipation    DDD (degenerative disc disease), lumbar    Hemorrhoids     Past Surgical History:  Procedure Laterality Date   ABDOMINAL HYSTERECTOMY     BREAST BIOPSY     Bil/ benign    Family History  Problem Relation Age of Onset   Diabetes Mother    Hyperlipidemia Mother    Cancer Father        prostate   Diabetes Sister    Hyperlipidemia Sister    Hypertension Sister    Colon cancer Neg Hx    Breast cancer Neg Hx     Social History   Socioeconomic History   Marital status: Married    Spouse name: Not on file   Number of children: Not on file   Years of education: Not on file   Highest education level: Not on file  Occupational History   Not on file  Tobacco Use   Smoking status: Never   Smokeless tobacco: Never  Substance and Sexual Activity   Alcohol use: Yes    Alcohol/week: 2.0 standard drinks of alcohol     Types: 2 Glasses of wine per week   Drug use: No   Sexual activity: Not on file  Other Topics Concern   Not on file  Social History Narrative   Not on file   Social Determinants of Health   Financial Resource Strain: Not on file  Food Insecurity: Not on file  Transportation Needs: Not on file  Physical Activity: Not on file  Stress: Not on file  Social Connections: Not on file  Intimate Partner Violence: Not on file    Outpatient Medications Prior to Visit  Medication Sig Dispense Refill   cetirizine (ZYRTEC) 10 MG tablet Take 20 mg by mouth as needed. Reported on 04/04/2016     diphenhydrAMINE (BENADRYL) 25 MG tablet Take 25 mg by mouth every 6 (six) hours as needed. Reported on 04/04/2016     ibuprofen (ADVIL) 600 MG tablet Take 1 tablet (600 mg total) by mouth every 8 (eight) hours as needed. 30 tablet 0   lidocaine (XYLOCAINE) 2 % solution Use as directed 10 mLs in the mouth or throat every 4 (four) hours  as needed for mouth pain. 100 mL 0   Misc Natural Products (COLON CLEANSER PO) Take 3 capsules by mouth once a week.     montelukast (SINGULAIR) 10 MG tablet Take 1 tablet (10 mg total) by mouth at bedtime. 30 tablet 1   Olopatadine HCl 0.2 % SOLN Apply 2 drops to eye 2 (two) times daily as needed (allergies, itching eye). 5 mL 11   PARoxetine (PAXIL) 10 MG tablet Take 1 tablet (10 mg total) by mouth every morning. 90 tablet 3   pravastatin (PRAVACHOL) 20 MG tablet TAKE 1 TABLET ONCE DAILY. 90 tablet 1   traMADol (ULTRAM) 50 MG tablet TAKE 1 TABLET (50MG ) BY MOUTH EVERY 6 HOURS AS NEEDED. 100 tablet 0   No facility-administered medications prior to visit.    No Known Allergies  ROS     Objective:    Physical Exam Constitutional:      General: She is not in acute distress.    Appearance: She is not ill-appearing.  HENT:     Mouth/Throat:     Mouth: Mucous membranes are moist.  Eyes:     Extraocular Movements: Extraocular movements intact.     Conjunctiva/sclera:  Conjunctivae normal.  Cardiovascular:     Rate and Rhythm: Normal rate and regular rhythm.  Pulmonary:     Effort: Pulmonary effort is normal.     Breath sounds: Normal breath sounds.  Abdominal:     General: There is no distension.     Palpations: Abdomen is soft.     Tenderness: There is no abdominal tenderness.  Musculoskeletal:     Cervical back: Normal range of motion and neck supple.     Right lower leg: No edema.     Left lower leg: No edema.  Skin:    General: Skin is warm and dry.  Neurological:     General: No focal deficit present.     Mental Status: She is alert and oriented to person, place, and time.     Cranial Nerves: No cranial nerve deficit.     Sensory: No sensory deficit.     Motor: No weakness.     Coordination: Coordination normal.     Gait: Gait normal.  Psychiatric:        Mood and Affect: Mood normal.        Behavior: Behavior normal.        Thought Content: Thought content normal.     BP 126/84 (BP Location: Left Arm, Patient Position: Sitting, Cuff Size: Large)   Pulse 85   Temp 97.6 F (36.4 C) (Temporal)   Ht 5\' 7"  (1.702 m)   Wt 161 lb (73 kg)   SpO2 98%   BMI 25.22 kg/m  Wt Readings from Last 3 Encounters:  08/12/22 160 lb (72.6 kg)  08/12/22 161 lb (73 kg)  03/29/22 167 lb 9.6 oz (76 kg)       Assessment & Plan:   Problem List Items Addressed This Visit       Other   Chest pain - Primary   Relevant Orders   CBC with Differential/Platelet (Completed)   Comprehensive metabolic panel (Completed)   Lipase (Completed)   Troponin I (High Sensitivity) (Completed)   EKG 12-Lead   Ambulatory referral to Cardiology   Fatigue   Relevant Orders   Troponin I (High Sensitivity) (Completed)   EKG 12-Lead   Upper back pain   Relevant Orders   Lipase (Completed)   Troponin I (High Sensitivity) (Completed)  EKG 12-Lead   Other Visit Diagnoses     Need for influenza vaccination       Relevant Orders   Flu Vaccine QUAD 5mo+IM  (Fluarix, Fluzone & Alfiuria Quad PF) (Completed)   Abnormal EKG       Relevant Orders   Ambulatory referral to Cardiology      She is pain free and at baseline today. Chest pain resolved after Mylanta 2 days ago.  Considered other differentials including ACS, dissection, PE, MSK and GERD.  EKG shows NSR, rate 86. Nonspecific ST and T wave abnormality  in V1, V2. Possible left atrial enlargement. No old EKGs for comparison.  Patient went to the lab for stat Troponin, CBC, CMP and lipase.  Patient observed in exam room until results are back.  Negative troponin and normal CBC, lipase with normal renal, liver functions. No significant electrolyte derangement.  Radiology department closed but I would have sent her for a CXR had it been open.  She is still pain free at the end of the visit so she may be discharged home with strict precautions to call 911 or go to the ED if she has chest pain, palpitations, shortness of breath, or any other new symptoms. A copy of her EKG was given to patient since it is after 5 pm and the EKG cannot be scanned in at this time.    I am having Sally Kidd maintain her cetirizine, Misc Natural Products (COLON CLEANSER PO), diphenhydrAMINE, traMADol, PARoxetine, lidocaine, pravastatin, Olopatadine HCl, montelukast, and ibuprofen.  No orders of the defined types were placed in this encounter.

## 2022-08-12 NOTE — Progress Notes (Signed)
These results were given in person.

## 2022-08-12 NOTE — ED Provider Triage Note (Signed)
Emergency Medicine Provider Triage Evaluation Note  Sally Kidd , a 56 y.o. female  was evaluated in triage.  Pt complains of left-sided chest pain for the last 2 weeks.  Patient reports that she thought was gas related, has been taking Mylanta and Gas-X with some relief.  Patient reports that pain always returns.  Patient states that is associated with shortness of breath.  Patient denies any fevers, nausea, vomiting or diarrhea.  Patient denies any leg swelling.  Review of Systems  Positive:  Negative:   Physical Exam  BP (!) 134/100 (BP Location: Right Arm)   Pulse 76   Temp 99.3 F (37.4 C) (Oral)   Resp 18   Ht 5\' 7"  (1.702 m)   Wt 72.6 kg   SpO2 97%   BMI 25.06 kg/m  Gen:   Awake, no distress   Resp:  Normal effort  MSK:   Moves extremities without difficulty  Other:    Medical Decision Making  Medically screening exam initiated at 7:41 PM.  Appropriate orders placed.  Sally Kidd was informed that the remainder of the evaluation will be completed by another provider, this initial triage assessment does not replace that evaluation, and the importance of remaining in the ED until their evaluation is complete.     Sally Lundborg, PA-C 08/12/22 1942

## 2022-08-12 NOTE — ED Triage Notes (Signed)
Pt c/o left sided chest pain through to the back x 2 weeks. Pt states she thinks its because of taking advil and ibuprofen together or gas.

## 2022-08-12 NOTE — Patient Instructions (Signed)
As discussed, if you have any chest pain, shortness of breath, upper back pain or any other worrisome symptoms then go to the closest emergency department or call 911.

## 2022-08-22 ENCOUNTER — Ambulatory Visit: Payer: BC Managed Care – PPO | Admitting: Family Medicine

## 2022-08-23 ENCOUNTER — Ambulatory Visit: Payer: BC Managed Care – PPO | Admitting: Internal Medicine

## 2022-08-23 ENCOUNTER — Telehealth: Payer: Self-pay

## 2022-08-23 NOTE — Telephone Encounter (Signed)
Patient did not stay at the hospital. Pt stated that she left the hospital. Pain said she is feeling fine and has been at home resting her experience was horrible and did not appreciate how she was being taken care of.

## 2022-09-07 ENCOUNTER — Other Ambulatory Visit: Payer: Self-pay | Admitting: Internal Medicine

## 2022-09-22 ENCOUNTER — Other Ambulatory Visit: Payer: Self-pay | Admitting: Family Medicine

## 2022-09-22 DIAGNOSIS — Z9109 Other allergy status, other than to drugs and biological substances: Secondary | ICD-10-CM

## 2022-09-29 ENCOUNTER — Other Ambulatory Visit: Payer: Self-pay | Admitting: Internal Medicine

## 2022-09-29 DIAGNOSIS — Z9109 Other allergy status, other than to drugs and biological substances: Secondary | ICD-10-CM

## 2022-10-26 ENCOUNTER — Ambulatory Visit: Payer: BC Managed Care – PPO | Attending: Cardiology | Admitting: Cardiology

## 2022-10-26 ENCOUNTER — Encounter: Payer: Self-pay | Admitting: Cardiology

## 2022-10-26 VITALS — BP 112/78 | HR 82 | Ht 67.0 in | Wt 150.0 lb

## 2022-10-26 DIAGNOSIS — Z01812 Encounter for preprocedural laboratory examination: Secondary | ICD-10-CM

## 2022-10-26 DIAGNOSIS — R072 Precordial pain: Secondary | ICD-10-CM

## 2022-10-26 MED ORDER — METOPROLOL TARTRATE 50 MG PO TABS: 50.0000 mg | ORAL_TABLET | Freq: Once | ORAL | 0 refills | Status: DC

## 2022-10-26 NOTE — Patient Instructions (Signed)
Medication Instructions:  Your physician recommends that you continue on your current medications as directed. Please refer to the Current Medication list given to you today.  *If you need a refill on your cardiac medications before your next appointment, please call your pharmacy*  Lab Work: In 2-3 weeks: BMET If you have labs (blood work) drawn today and your tests are completely normal, you will receive your results only by: [facility] (if you have MyChart) OR A paper copy in the mail If you have any lab test that is abnormal or we need to change your treatment, we will call you to review the results.  Testing/Procedures: Your physician has requested that you have an echocardiogram. Echocardiography is a painless test that uses sound waves to create images of your heart. It provides your doctor with information about the size and shape of your heart and how well your heart's chambers and valves are working. This procedure takes approximately one hour. There are no restrictions for this procedure. Please do NOT wear cologne, perfume, aftershave, or lotions (deodorant is allowed). Please arrive 15 minutes prior to your appointment time.  Your physician has requested that you have a coronary CTA. The scheduler from [facility] will call you to schedule this procedure. Please see instructions below.  Follow-Up: At [facility], you and your health needs are our priority.  As part of our continuing mission to provide you with exceptional heart care, we have created designated Provider Care Teams.  These Care Teams include your primary Cardiologist (physician) and Advanced Practice Providers (APPs -  Physician Assistants and Nurse Practitioners) who all work together to provide you with the care you need, when you need it.  Your next appointment:   As needed  Provider:   Candee Furbish, MD    Other Instructions   Your cardiac CT will be scheduled at:   [facility] [address] [place], [place] [address] [phone]  Please arrive at the [facility] (Entrance C2) of [facility] 30 minutes prior to test start time. You can use the FREE valet parking offered at entrance C (encouraged to control the heart rate for the test)  Proceed to the [facility] Radiology Department (first floor) to check-in and test prep.  All radiology patients and guests should use entrance C2 at [facility], accessed from [facility], even though the hospital's physical address listed is [address]    Please follow these instructions carefully (unless otherwise directed):  On the Night Before the Test: Be sure to Drink plenty of water. Do not consume any caffeinated/decaffeinated beverages or chocolate 12 hours prior to your test. Do not take any antihistamines 12 hours prior to your test.  On the Day of the Test: Drink plenty of water until 1 hour prior to the test. Do not eat any food 1 hour prior to test. You may take your regular medications prior to the test.  Take metoprolol (Lopressor) 50mg  two hours prior to test. FEMALES- please wear underwire-free bra if available, avoid dresses & tight clothing  After the Test: Drink plenty of water. After receiving IV contrast, you may experience a mild flushed feeling. This is normal. On occasion, you may experience a mild rash up to 24 hours after the test. This is not dangerous. If this occurs, you can take Benadryl 25 mg and increase your fluid intake. If you experience trouble breathing, this can be serious. If  it is severe call 911 IMMEDIATELY. If it is mild, please call our office. If you take any of these medications: Glipizide/Metformin, Avandament, Glucavance, please do not take 48 hours after completing test unless otherwise instructed.  We will call to schedule your test 2-4 weeks out understanding that some insurance companies will  need an authorization prior to the service being performed.   For non-scheduling related questions, please contact the cardiac imaging nurse navigator should you have any questions/concerns: Marchia Bond, Cardiac Imaging Nurse Navigator Gordy Clement, Cardiac Imaging Nurse Navigator [facility] Direct Office Dial: [phone]   For scheduling needs, including cancellations and rescheduling, please call [place], [phone].
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
## 2022-11-15 ENCOUNTER — Ambulatory Visit: Payer: BC Managed Care – PPO

## 2022-11-15 ENCOUNTER — Ambulatory Visit (HOSPITAL_COMMUNITY): Payer: BC Managed Care – PPO | Attending: Cardiology

## 2022-11-15 DIAGNOSIS — R072 Precordial pain: Secondary | ICD-10-CM | POA: Insufficient documentation

## 2022-11-15 DIAGNOSIS — Z01812 Encounter for preprocedural laboratory examination: Secondary | ICD-10-CM | POA: Diagnosis present

## 2022-11-15 LAB — ECHOCARDIOGRAM COMPLETE
Area-P 1/2: 2.77 cm2
S' Lateral: 2.4 cm

## 2022-11-16 LAB — BASIC METABOLIC PANEL
BUN/Creatinine Ratio: 7 — ABNORMAL LOW (ref 9–23)
BUN: 6 mg/dL (ref 6–24)
CO2: 27 mmol/L (ref 20–29)
Calcium: 9.4 mg/dL (ref 8.7–10.2)
Chloride: 104 mmol/L (ref 96–106)
Creatinine, Ser: 0.89 mg/dL (ref 0.57–1.00)
Glucose: 96 mg/dL (ref 70–99)
Potassium: 4.2 mmol/L (ref 3.5–5.2)
Sodium: 143 mmol/L (ref 134–144)
eGFR: 76 mL/min/{1.73_m2} (ref >59)

## 2022-11-29 ENCOUNTER — Ambulatory Visit: Payer: BC Managed Care – PPO | Admitting: Internal Medicine

## 2022-11-29 VITALS — BP 114/74 | HR 89 | Temp 98.3°F | Ht 67.0 in | Wt 152.0 lb

## 2022-11-29 DIAGNOSIS — J3489 Other specified disorders of nose and nasal sinuses: Secondary | ICD-10-CM | POA: Diagnosis not present

## 2022-11-29 DIAGNOSIS — Z9109 Other allergy status, other than to drugs and biological substances: Secondary | ICD-10-CM | POA: Diagnosis not present

## 2022-11-29 LAB — POC INFLUENZA A&B (BINAX/QUICKVUE)
Influenza A, POC: NEGATIVE
Influenza B, POC: NEGATIVE

## 2022-11-29 LAB — POC SOFIA SARS ANTIGEN FIA: SARS Coronavirus 2 Ag: NEGATIVE

## 2022-11-29 LAB — POCT RESPIRATORY SYNCYTIAL VIRUS: RSV Rapid Ag: NEGATIVE

## 2022-11-29 MED ORDER — AZITHROMYCIN 250 MG PO TABS: ORAL_TABLET | ORAL | 1 refills | Status: AC

## 2022-11-29 MED ORDER — MONTELUKAST SODIUM 10 MG PO TABS: 10.0000 mg | ORAL_TABLET | Freq: Every day | ORAL | 1 refills | Status: DC

## 2022-11-29 MED ORDER — HYDROCODONE BIT-HOMATROP MBR 5-1.5 MG/5ML PO SOLN: 5.0000 mL | Freq: Four times a day (QID) | ORAL | 0 refills | Status: AC | PRN

## 2022-11-29 NOTE — Patient Instructions (Addendum)
Your COVID, Flu and RSV testing is negative  Please take all new medication as prescribed - the antibiotic, cough medicine, and monteluklast  Please continue all other medications as before, and refills have been done if requested.  Please have the pharmacy call with any other refills you may need.  Please keep your appointments with your specialists as you may have planned

## 2022-11-29 NOTE — Progress Notes (Signed)
Patient ID: Sally Kidd, female   DOB: 06-05-1966, 57 y.o.   MRN: [id number]        Chief Complaint: follow up sinus pain, allergies       HPI:  Sally Kidd is a 57 y.o. female here with c/o 1 wk sinus discomfort and congestion, yellow green drainage, feeling feverish at times.  Does have hx of ongoing nasal allergy symptoms with clearish congestion, itch and sneezing, without fever, pain, ST, cough, swelling or wheezing.   Pt denies polydipsia, polyuria, or new focal neuro s/s.    Pt denies fever, wt loss, night sweats, loss of appetite, or other constitutional symptoms         Wt Readings from Last 3 Encounters:  11/29/22 152 lb (68.9 kg)  10/26/22 150 lb (68 kg)  08/12/22 160 lb (72.6 kg)   BP Readings from Last 3 Encounters:  11/29/22 114/74  10/26/22 112/78  08/12/22 (!) 134/100         Past Medical History:  Diagnosis Date   Back pain    back injury due to fall in 2013   Constipation    DDD (degenerative disc disease), lumbar    Hemorrhoids    Past Surgical History:  Procedure Laterality Date   ABDOMINAL HYSTERECTOMY     BREAST BIOPSY     Bil/ benign    reports that she has never smoked. She has never used smokeless tobacco. She reports current alcohol use of about 2.0 standard drinks of alcohol per week. She reports that she does not use drugs. family history includes Cancer in her father; Diabetes in her mother and sister; Hyperlipidemia in her mother and sister; Hypertension in her sister. No Known Allergies Current Outpatient Medications on File Prior to Visit  Medication Sig Dispense Refill   Aspirin-Caffeine 845-65 MG PACK Take by mouth as needed (back pain).     cetirizine (ZYRTEC) 10 MG tablet Take 20 mg by mouth as needed. Reported on 04/04/2016     diphenhydrAMINE (BENADRYL) 25 MG tablet Take 25 mg by mouth every 6 (six) hours as needed. Reported on 04/04/2016     ibuprofen (ADVIL) 600 MG tablet Take 1 tablet (600 mg total) by mouth every 8 (eight)  hours as needed. 30 tablet 0   lidocaine (XYLOCAINE) 2 % solution Use as directed 10 mLs in the mouth or throat every 4 (four) hours as needed for mouth pain. 100 mL 0   metoprolol tartrate (LOPRESSOR) 50 MG tablet Take 1 tablet (50 mg total) by mouth once for 1 dose. Take 90-120 minutes prior to scan. 1 tablet 0   Misc Natural Products (COLON CLEANSER PO) Take 3 capsules by mouth once a week.     Olopatadine HCl 0.2 % SOLN Apply 2 drops to eye 2 (two) times daily as needed (allergies, itching eye). 5 mL 11   PARoxetine (PAXIL) 10 MG tablet Take 1 tablet (10 mg total) by mouth every morning. 90 tablet 3   pravastatin (PRAVACHOL) 20 MG tablet TAKE 1 TABLET ONCE DAILY. 90 tablet 1   traMADol (ULTRAM) 50 MG tablet TAKE 1 TABLET ('50MG'$ ) BY MOUTH EVERY 6 HOURS AS NEEDED. (Patient taking differently: 50 mg daily. Take 4-5 per day for pain) 100 tablet 0   No current facility-administered medications on file prior to visit.        ROS:  All others reviewed and negative.  Objective        PE:  BP 114/74 (BP Location: Right Arm, Patient Position:  Sitting, Cuff Size: Normal)   Pulse 89   Temp 98.3 F (36.8 C) (Oral)   Ht '5\' 7"'$  (1.702 m)   Wt 152 lb (68.9 kg)   SpO2 98%   BMI 23.81 kg/m                 Constitutional: Pt appears in NAD               HENT: Head: NCAT.                Right Ear: External ear normal.                 Left Ear: External ear normal.  Bilat tm's with mild erythema.  Max sinus areas mild tender.  Pharynx with mild erythema, no exudate               Eyes: . Pupils are equal, round, and reactive to light. Conjunctivae and EOM are normal               Nose: without d/c or deformity               Neck: Neck supple. Gross normal ROM               Cardiovascular: Normal rate and regular rhythm.                 Pulmonary/Chest: Effort normal and breath sounds without rales or wheezing.                               Neurological: Pt is alert. At baseline orientation, motor  grossly intact               Skin: Skin is warm. No rashes, no other new lesions, LE edema - none               Psychiatric: Pt behavior is normal without agitation   Micro: none  Cardiac tracings I have personally interpreted today:  none  Pertinent Radiological findings (summarize): none   Lab Results  Component Value Date   WBC 7.5 08/12/2022   HGB 13.0 08/12/2022   HCT 37.3 08/12/2022   PLT 285 08/12/2022   GLUCOSE 96 11/15/2022   CHOL 257 (H) 09/04/2020   TRIG 246.0 (H) 09/04/2020   HDL 43.80 09/04/2020   LDLDIRECT 162.0 09/04/2020   LDLCALC 105 (H) 09/24/2019   ALT 12 08/12/2022   AST 17 08/12/2022   NA 143 11/15/2022   K 4.2 11/15/2022   CL 104 11/15/2022   CREATININE 0.89 11/15/2022   BUN 6 11/15/2022   CO2 27 11/15/2022   TSH 1.34 04/12/2018   HGBA1C 6.2 09/04/2020   POCT - COVID, flu, RSV - negative  Assessment/Plan:  Sally Kidd is a 57 y.o. Black or African American [2] female with  has a past medical history of Back pain, Constipation, DDD (degenerative disc disease), lumbar, and Hemorrhoids.  Sinus pain Poct testing neg today, suspect element of sinusitis - for antibiotic, cough med prn, and  to f/u any worsening symptoms or concerns  Environmental allergies Mild to mod, for restart singulair 10 qd,,  to f/u any worsening symptoms or concerns  Followup: Return if symptoms worsen or fail to improve.  Cathlean Cower, MD 12/03/2022 6:54 AM [facility]

## 2022-12-03 ENCOUNTER — Encounter: Payer: Self-pay | Admitting: Internal Medicine

## 2022-12-03 NOTE — Assessment & Plan Note (Signed)
Poct testing neg today, suspect element of sinusitis - for antibiotic, cough med prn, and  to f/u any worsening symptoms or concerns

## 2022-12-03 NOTE — Assessment & Plan Note (Signed)
Mild to mod, for restart singulair 10 qd,,  to f/u any worsening symptoms or concerns

## 2023-01-12 ENCOUNTER — Encounter (HOSPITAL_COMMUNITY): Payer: Self-pay

## 2023-02-21 ENCOUNTER — Other Ambulatory Visit: Payer: Self-pay | Admitting: Internal Medicine

## 2023-02-24 ENCOUNTER — Encounter: Payer: Self-pay | Admitting: Internal Medicine

## 2023-02-24 ENCOUNTER — Ambulatory Visit: Payer: BC Managed Care – PPO | Admitting: Internal Medicine

## 2023-02-24 VITALS — BP 102/80 | Temp 98.4°F | Ht 67.0 in | Wt 152.0 lb

## 2023-02-24 DIAGNOSIS — Z0000 Encounter for general adult medical examination without abnormal findings: Secondary | ICD-10-CM | POA: Diagnosis not present

## 2023-02-24 DIAGNOSIS — M545 Low back pain, unspecified: Secondary | ICD-10-CM

## 2023-02-24 DIAGNOSIS — E782 Mixed hyperlipidemia: Secondary | ICD-10-CM

## 2023-02-24 DIAGNOSIS — R439 Unspecified disturbances of smell and taste: Secondary | ICD-10-CM | POA: Diagnosis not present

## 2023-02-24 DIAGNOSIS — F1120 Opioid dependence, uncomplicated: Secondary | ICD-10-CM

## 2023-02-24 DIAGNOSIS — G8929 Other chronic pain: Secondary | ICD-10-CM | POA: Diagnosis not present

## 2023-02-24 LAB — COMPREHENSIVE METABOLIC PANEL
ALT: 10 U/L (ref 0–35)
AST: 14 U/L (ref 0–37)
Albumin: 4.1 g/dL (ref 3.5–5.2)
Alkaline Phosphatase: 60 U/L (ref 39–117)
BUN: 12 mg/dL (ref 6–23)
CO2: 32 mEq/L (ref 19–32)
Calcium: 9.6 mg/dL (ref 8.4–10.5)
Chloride: 103 mEq/L (ref 96–112)
Creatinine, Ser: 0.96 mg/dL (ref 0.40–1.20)
GFR: 65.46 mL/min (ref >60.00)
Glucose, Bld: 109 mg/dL — ABNORMAL HIGH (ref 70–99)
Potassium: 4.4 mEq/L (ref 3.5–5.1)
Sodium: 142 mEq/L (ref 135–145)
Total Bilirubin: 0.3 mg/dL (ref 0.2–1.2)
Total Protein: 7 g/dL (ref 6.0–8.3)

## 2023-02-24 LAB — POC COVID19 BINAXNOW: SARS Coronavirus 2 Ag: NEGATIVE

## 2023-02-24 LAB — CBC
HCT: 37 % (ref 36.0–46.0)
Hemoglobin: 12.4 g/dL (ref 12.0–15.0)
MCHC: 33.6 g/dL (ref 30.0–36.0)
MCV: 86.2 fl (ref 78.0–100.0)
Platelets: 261 10*3/uL (ref 150.0–400.0)
RBC: 4.29 Mil/uL (ref 3.87–5.11)
RDW: 13 % (ref 11.5–15.5)
WBC: 6.6 10*3/uL (ref 4.0–10.5)

## 2023-02-24 LAB — LIPID PANEL
Cholesterol: 224 mg/dL — ABNORMAL HIGH (ref 0–200)
HDL: 53.9 mg/dL (ref >39.00)
NonHDL: 169.97
Total CHOL/HDL Ratio: 4
Triglycerides: 304 mg/dL — ABNORMAL HIGH (ref 0.0–149.0)
VLDL: 60.8 mg/dL — ABNORMAL HIGH (ref 0.0–40.0)

## 2023-02-24 LAB — HEMOGLOBIN A1C: Hgb A1c MFr Bld: 6.1 % (ref 4.6–6.5)

## 2023-02-24 LAB — LDL CHOLESTEROL, DIRECT: Direct LDL: 131 mg/dL

## 2023-02-24 MED ORDER — PAROXETINE HCL 10 MG PO TABS: 10.0000 mg | ORAL_TABLET | Freq: Every morning | ORAL | 3 refills | Status: DC

## 2023-02-24 MED ORDER — PROMETHAZINE-DM 6.25-15 MG/5ML PO SYRP: 5.0000 mL | ORAL_SOLUTION | Freq: Four times a day (QID) | ORAL | 0 refills | Status: DC | PRN

## 2023-02-24 NOTE — Assessment & Plan Note (Signed)
Chronic and seeing orthopedic for her tramadol.

## 2023-02-24 NOTE — Assessment & Plan Note (Signed)
Seeing orthopedics and on chronic tramadol from orthopedics.

## 2023-02-24 NOTE — Progress Notes (Signed)
   Subjective:   Patient ID: Sally Kidd, female    DOB: April 01, 1966, 57 y.o.   MRN: [id number]  HPI The patient is here for physical. Also has acute concerns about loss of smell and cough  PMH, FMH, social history reviewed and updated  Review of Systems  Constitutional: Negative.   HENT: Negative.    Eyes: Negative.   Respiratory:  Positive for cough. Negative for chest tightness and shortness of breath.   Cardiovascular:  Negative for chest pain, palpitations and leg swelling.  Gastrointestinal:  Negative for abdominal distention, abdominal pain, constipation, diarrhea, nausea and vomiting.  Musculoskeletal: Negative.   Skin: Negative.   Neurological: Negative.   Psychiatric/Behavioral: Negative.      Objective:  Physical Exam Constitutional:      Appearance: She is well-developed.  HENT:     Head: Normocephalic and atraumatic.  Cardiovascular:     Rate and Rhythm: Normal rate and regular rhythm.  Pulmonary:     Effort: Pulmonary effort is normal. No respiratory distress.     Breath sounds: Normal breath sounds. No wheezing or rales.  Abdominal:     General: Bowel sounds are normal. There is no distension.     Palpations: Abdomen is soft.     Tenderness: There is no abdominal tenderness. There is no rebound.  Musculoskeletal:     Cervical back: Normal range of motion.  Skin:    General: Skin is warm and dry.  Neurological:     Mental Status: She is alert and oriented to person, place, and time.     Coordination: Coordination normal.     Vitals:   02/24/23 1549  BP: 102/80  Temp: 98.4 F (36.9 C)  TempSrc: Oral  Weight: 152 lb (68.9 kg)  Height: 5\' 7"  (1.702 m)    Assessment & Plan:

## 2023-02-24 NOTE — Assessment & Plan Note (Signed)
Checking lipid panel and adjust as needed. Not taking her pravastatin lately.

## 2023-02-24 NOTE — Patient Instructions (Addendum)
Call [facility] to schedule colonoscopy.

## 2023-02-24 NOTE — Assessment & Plan Note (Signed)
Flu shot yearly. Shingrix complete. Tetanus due. Colonoscopy due 2026. Mammogram up to date, pap smear up to date. Counseled about sun safety and mole surveillance. Counseled about the dangers of distracted driving. Given 10 year screening recommendations.

## 2023-02-24 NOTE — Assessment & Plan Note (Signed)
POC covid-19 done due to loss of smell and cough 1 day which is negative. Rx promethazine/dm done for cough.

## 2023-05-01 ENCOUNTER — Other Ambulatory Visit: Payer: Self-pay | Admitting: Internal Medicine

## 2023-05-01 DIAGNOSIS — Z1231 Encounter for screening mammogram for malignant neoplasm of breast: Secondary | ICD-10-CM

## 2023-05-15 ENCOUNTER — Ambulatory Visit
Admission: RE | Admit: 2023-05-15 | Discharge: 2023-05-15 | Disposition: A | Discharge status: Home / Self Care | Payer: BC Managed Care – PPO | Source: Ambulatory Visit | Attending: Internal Medicine | Admitting: Internal Medicine

## 2023-05-15 DIAGNOSIS — Z1231 Encounter for screening mammogram for malignant neoplasm of breast: Secondary | ICD-10-CM

## 2023-05-17 ENCOUNTER — Encounter: Payer: Self-pay | Admitting: Internal Medicine

## 2023-05-17 LAB — HM MAMMOGRAPHY

## 2023-06-09 ENCOUNTER — Ambulatory Visit: Payer: BC Managed Care – PPO | Admitting: Internal Medicine

## 2023-07-10 ENCOUNTER — Ambulatory Visit: Payer: BC Managed Care – PPO | Admitting: Internal Medicine

## 2023-07-10 ENCOUNTER — Encounter: Payer: Self-pay | Admitting: Internal Medicine

## 2023-07-10 VITALS — BP 112/80 | HR 74 | Temp 98.5°F | Ht 67.0 in | Wt 145.0 lb

## 2023-07-10 DIAGNOSIS — K921 Melena: Secondary | ICD-10-CM | POA: Diagnosis not present

## 2023-07-10 DIAGNOSIS — R7303 Prediabetes: Secondary | ICD-10-CM

## 2023-07-10 DIAGNOSIS — Z23 Encounter for immunization: Secondary | ICD-10-CM | POA: Diagnosis not present

## 2023-07-10 DIAGNOSIS — Z0000 Encounter for general adult medical examination without abnormal findings: Secondary | ICD-10-CM

## 2023-07-10 DIAGNOSIS — E782 Mixed hyperlipidemia: Secondary | ICD-10-CM

## 2023-07-10 DIAGNOSIS — Z9109 Other allergy status, other than to drugs and biological substances: Secondary | ICD-10-CM | POA: Diagnosis not present

## 2023-07-10 MED ORDER — MINOXIDIL 5 % EX SOLN: CUTANEOUS | 11 refills | Status: AC

## 2023-07-10 MED ORDER — PROMETHAZINE-DM 6.25-15 MG/5ML PO SYRP: 5.0000 mL | ORAL_SOLUTION | Freq: Four times a day (QID) | ORAL | 0 refills | Status: DC | PRN

## 2023-07-10 MED ORDER — MONTELUKAST SODIUM 10 MG PO TABS: 10.0000 mg | ORAL_TABLET | Freq: Every day | ORAL | 3 refills | Status: DC

## 2023-07-10 MED ORDER — PRAVASTATIN SODIUM 20 MG PO TABS: 20.0000 mg | ORAL_TABLET | Freq: Every day | ORAL | 3 refills | Status: DC

## 2023-07-10 NOTE — Patient Instructions (Addendum)
We will check the labs today and have sent in the topical for the hair scalp.

## 2023-07-10 NOTE — Progress Notes (Unsigned)
   Subjective:   Patient ID: Sally Kidd, female    DOB: Sep 06, 1966, 57 y.o.   MRN: [id number]  HPI The patient is a 57 YO female coming in for intermittent hemorrhoidal bleeding, hair thinning and feeling like hands are cold. Gets cough typically this time of year and desires refill of cough medicine.  Review of Systems  Constitutional: Negative.   HENT: Negative.    Eyes: Negative.   Respiratory:  Negative for cough, chest tightness and shortness of breath.   Cardiovascular:  Negative for chest pain, palpitations and leg swelling.  Gastrointestinal:  Negative for abdominal distention, abdominal pain, constipation, diarrhea, nausea and vomiting.  Endocrine: Positive for cold intolerance.  Musculoskeletal: Negative.   Skin: Negative.   Neurological: Negative.   Psychiatric/Behavioral: Negative.      Objective:  Physical Exam Constitutional:      Appearance: She is well-developed.  HENT:     Head: Normocephalic and atraumatic.  Cardiovascular:     Rate and Rhythm: Normal rate and regular rhythm.  Pulmonary:     Effort: Pulmonary effort is normal. No respiratory distress.     Breath sounds: Normal breath sounds. No wheezing or rales.  Abdominal:     General: Bowel sounds are normal. There is no distension.     Palpations: Abdomen is soft.     Tenderness: There is no abdominal tenderness. There is no rebound.  Musculoskeletal:        General: Tenderness present.     Cervical back: Normal range of motion.  Skin:    General: Skin is warm and dry.  Neurological:     Mental Status: She is alert and oriented to person, place, and time.     Coordination: Coordination normal.     Vitals:   07/10/23 1614  BP: 112/80  Pulse: 74  Temp: 98.5 F (36.9 C)  TempSrc: Oral  SpO2: 99%  Weight: 145 lb (65.8 kg)  Height: 5\' 7"  (1.702 m)    Assessment & Plan:  Flu shot given at visit

## 2023-07-13 ENCOUNTER — Encounter: Payer: Self-pay | Admitting: Internal Medicine

## 2023-07-13 DIAGNOSIS — R7303 Prediabetes: Secondary | ICD-10-CM | POA: Insufficient documentation

## 2023-07-13 DIAGNOSIS — K921 Melena: Secondary | ICD-10-CM | POA: Insufficient documentation

## 2023-07-13 NOTE — Assessment & Plan Note (Signed)
Referral to GI for blood in stool as she is concerned she may need earlier than recommended colonoscopy due to this.

## 2023-07-13 NOTE — Assessment & Plan Note (Signed)
Checking lipid panel and adjust as needed pravastatin 20 mg daily.

## 2023-07-13 NOTE — Assessment & Plan Note (Signed)
Checking HgA1c and adjust as needed.  

## 2023-07-13 NOTE — Assessment & Plan Note (Signed)
Will not rx any cough products with hydrocodne due to her chronic tramadol usage. Refill promethazine/dm to use. She will continue zyrtec and singulair.

## 2023-08-03 ENCOUNTER — Other Ambulatory Visit (INDEPENDENT_AMBULATORY_CARE_PROVIDER_SITE_OTHER): Payer: BC Managed Care – PPO

## 2023-08-03 ENCOUNTER — Other Ambulatory Visit: Payer: Self-pay | Admitting: Internal Medicine

## 2023-08-03 DIAGNOSIS — K921 Melena: Secondary | ICD-10-CM | POA: Diagnosis not present

## 2023-08-03 DIAGNOSIS — R7303 Prediabetes: Secondary | ICD-10-CM

## 2023-08-03 LAB — CBC WITH DIFFERENTIAL/PLATELET
Basophils Absolute: 0 10*3/uL (ref 0.0–0.1)
Basophils Relative: 1 % (ref 0.0–3.0)
Eosinophils Absolute: 0.1 10*3/uL (ref 0.0–0.7)
Eosinophils Relative: 3.1 % (ref 0.0–5.0)
HCT: 38.9 % (ref 36.0–46.0)
Hemoglobin: 13 g/dL (ref 12.0–15.0)
Lymphocytes Relative: 45.6 % (ref 12.0–46.0)
Lymphs Abs: 2.1 10*3/uL (ref 0.7–4.0)
MCHC: 33.4 g/dL (ref 30.0–36.0)
MCV: 86.8 fL (ref 78.0–100.0)
Monocytes Absolute: 0.3 10*3/uL (ref 0.1–1.0)
Monocytes Relative: 6 % (ref 3.0–12.0)
Neutro Abs: 2.1 10*3/uL (ref 1.4–7.7)
Neutrophils Relative %: 44.3 % (ref 43.0–77.0)
Platelets: 250 10*3/uL (ref 150.0–400.0)
RBC: 4.49 Mil/uL (ref 3.87–5.11)
RDW: 13 % (ref 11.5–15.5)
WBC: 4.7 10*3/uL (ref 4.0–10.5)

## 2023-08-03 LAB — LIPID PANEL
Cholesterol: 199 mg/dL (ref 0–200)
HDL: 63.5 mg/dL (ref >39.00)
LDL Cholesterol: 113 mg/dL — ABNORMAL HIGH (ref 0–99)
NonHDL: 135.24
Total CHOL/HDL Ratio: 3
Triglycerides: 110 mg/dL (ref 0.0–149.0)
VLDL: 22 mg/dL (ref 0.0–40.0)

## 2023-08-03 LAB — VITAMIN D 25 HYDROXY (VIT D DEFICIENCY, FRACTURES): VITD: 33.89 ng/mL (ref 30.00–100.00)

## 2023-08-03 LAB — HEMOGLOBIN A1C: Hgb A1c MFr Bld: 5.9 % (ref 4.6–6.5)

## 2023-08-03 LAB — VITAMIN B12: Vitamin B-12: 590 pg/mL (ref 211–911)

## 2023-09-04 ENCOUNTER — Ambulatory Visit: Payer: BC Managed Care – PPO | Admitting: Internal Medicine

## 2023-11-14 ENCOUNTER — Other Ambulatory Visit: Payer: Self-pay | Admitting: Family Medicine

## 2024-03-04 ENCOUNTER — Ambulatory Visit: Payer: Self-pay | Admitting: Internal Medicine

## 2024-03-25 ENCOUNTER — Ambulatory Visit: Payer: Self-pay | Admitting: Internal Medicine

## 2024-03-25 ENCOUNTER — Encounter: Payer: Self-pay | Admitting: Internal Medicine

## 2024-03-25 VITALS — BP 118/88 | HR 89 | Temp 99.0°F | Ht 67.0 in | Wt 151.0 lb

## 2024-03-25 DIAGNOSIS — J3089 Other allergic rhinitis: Secondary | ICD-10-CM

## 2024-03-25 DIAGNOSIS — E782 Mixed hyperlipidemia: Secondary | ICD-10-CM

## 2024-03-25 DIAGNOSIS — R7303 Prediabetes: Secondary | ICD-10-CM | POA: Diagnosis not present

## 2024-03-25 DIAGNOSIS — G8929 Other chronic pain: Secondary | ICD-10-CM

## 2024-03-25 DIAGNOSIS — F1120 Opioid dependence, uncomplicated: Secondary | ICD-10-CM

## 2024-03-25 DIAGNOSIS — Z9109 Other allergy status, other than to drugs and biological substances: Secondary | ICD-10-CM

## 2024-03-25 DIAGNOSIS — K047 Periapical abscess without sinus: Secondary | ICD-10-CM

## 2024-03-25 DIAGNOSIS — Z0000 Encounter for general adult medical examination without abnormal findings: Secondary | ICD-10-CM

## 2024-03-25 DIAGNOSIS — M545 Low back pain, unspecified: Secondary | ICD-10-CM | POA: Diagnosis not present

## 2024-03-25 LAB — LIPID PANEL
Cholesterol: 266 mg/dL — ABNORMAL HIGH (ref 0–200)
HDL: 64.4 mg/dL (ref >39.00)
LDL Cholesterol: 154 mg/dL — ABNORMAL HIGH (ref 0–99)
NonHDL: 201.65
Total CHOL/HDL Ratio: 4
Triglycerides: 240 mg/dL — ABNORMAL HIGH (ref 0.0–149.0)
VLDL: 48 mg/dL — ABNORMAL HIGH (ref 0.0–40.0)

## 2024-03-25 LAB — CBC
HCT: 38.6 % (ref 36.0–46.0)
Hemoglobin: 13.5 g/dL (ref 12.0–15.0)
MCHC: 35 g/dL (ref 30.0–36.0)
MCV: 82.4 fl (ref 78.0–100.0)
Platelets: 269 10*3/uL (ref 150.0–400.0)
RBC: 4.69 Mil/uL (ref 3.87–5.11)
RDW: 12.8 % (ref 11.5–15.5)
WBC: 5.6 10*3/uL (ref 4.0–10.5)

## 2024-03-25 LAB — VITAMIN D 25 HYDROXY (VIT D DEFICIENCY, FRACTURES): VITD: 46.54 ng/mL (ref 30.00–100.00)

## 2024-03-25 LAB — HEMOGLOBIN A1C: Hgb A1c MFr Bld: 6.5 % (ref 4.6–6.5)

## 2024-03-25 MED ORDER — FLUCONAZOLE 150 MG PO TABS: 150.0000 mg | ORAL_TABLET | ORAL | 0 refills | Status: DC

## 2024-03-25 MED ORDER — THERA VITAL M PO TABS: 1.0000 | ORAL_TABLET | Freq: Every day | ORAL | 3 refills | Status: AC

## 2024-03-25 MED ORDER — AMOXICILLIN-POT CLAVULANATE 875-125 MG PO TABS: 1.0000 | ORAL_TABLET | Freq: Two times a day (BID) | ORAL | 0 refills | Status: AC

## 2024-03-25 NOTE — Progress Notes (Unsigned)
   Subjective:   Patient ID: Sally Kidd, female    DOB: 16-Jan-1966, 58 y.o.   MRN: [id number]  HPI The patient is a 58 YO female coming in for physical. Also having new problem of dental infection unable to get fixed for a few months.  PMH, [facility], social history reviewed and updated  Review of Systems  Constitutional: Negative.   HENT:  Positive for dental problem.   Eyes: Negative.   Respiratory:  Negative for cough, chest tightness and shortness of breath.   Cardiovascular:  Negative for chest pain, palpitations and leg swelling.  Gastrointestinal:  Negative for abdominal distention, abdominal pain, constipation, diarrhea, nausea and vomiting.  Musculoskeletal:  Positive for back pain and neck pain.  Skin: Negative.   Neurological: Negative.   Psychiatric/Behavioral: Negative.      Objective:  Physical Exam Constitutional:      Appearance: She is well-developed.  HENT:     Head: Normocephalic and atraumatic.     Comments: Dental infection  Cardiovascular:     Rate and Rhythm: Normal rate and regular rhythm.  Pulmonary:     Effort: Pulmonary effort is normal. No respiratory distress.     Breath sounds: Normal breath sounds. No wheezing or rales.  Abdominal:     General: Bowel sounds are normal. There is no distension.     Palpations: Abdomen is soft.     Tenderness: There is no abdominal tenderness. There is no rebound.   Musculoskeletal:     Cervical back: Normal range of motion.   Skin:    General: Skin is warm and dry.   Neurological:     Mental Status: She is alert and oriented to person, place, and time.     Coordination: Coordination normal.     Vitals:   03/25/24 1530  BP: 118/88  Pulse: 89  Temp: 99 F (37.2 C)  TempSrc: Oral  SpO2: 97%  Weight: 151 lb (68.5 kg)  Height: 5' 7 (1.702 m)    Assessment & Plan:

## 2024-03-26 LAB — COMPREHENSIVE METABOLIC PANEL WITH GFR
ALT: 13 U/L (ref 0–35)
AST: 19 U/L (ref 0–37)
Albumin: 4.5 g/dL (ref 3.5–5.2)
Alkaline Phosphatase: 60 U/L (ref 39–117)
BUN: 13 mg/dL (ref 6–23)
CO2: 28 meq/L (ref 19–32)
Calcium: 9.8 mg/dL (ref 8.4–10.5)
Chloride: 103 meq/L (ref 96–112)
Creatinine, Ser: 0.9 mg/dL (ref 0.40–1.20)
GFR: 70.2 mL/min (ref >60.00)
Glucose, Bld: 91 mg/dL (ref 70–99)
Potassium: 4.5 meq/L (ref 3.5–5.1)
Sodium: 140 meq/L (ref 135–145)
Total Bilirubin: 0.7 mg/dL (ref 0.2–1.2)
Total Protein: 7.5 g/dL (ref 6.0–8.3)

## 2024-03-28 DIAGNOSIS — K047 Periapical abscess without sinus: Secondary | ICD-10-CM | POA: Insufficient documentation

## 2024-03-28 NOTE — Assessment & Plan Note (Signed)
 Flu shot yearly. Shingrix  complete. Tetanus due. Colonoscopy up to date. Mammogram up to date, pap smear up to date. Counseled about sun safety and mole surveillance. Counseled about the dangers of distracted driving. Given 10 year screening recommendations.

## 2024-03-28 NOTE — Assessment & Plan Note (Signed)
 Stable and seeing pain management for tramadol  rx.

## 2024-03-28 NOTE — Assessment & Plan Note (Signed)
 Taking tramadol  4-5 per day and seeing pain management for ongoing use. She denies change in management.

## 2024-03-28 NOTE — Assessment & Plan Note (Signed)
 Unable to get fixed for a few months so rx augmentin  done today and diflucan to help until she is able to do this.

## 2024-03-28 NOTE — Assessment & Plan Note (Signed)
 Checking HgA1c and adjust as needed.

## 2024-03-28 NOTE — Assessment & Plan Note (Signed)
 Taking singulair  and flonase and overall improved with different housing.

## 2024-03-28 NOTE — Assessment & Plan Note (Signed)
 Checking lipid panel and adjust as needed statin.

## 2024-03-28 NOTE — Assessment & Plan Note (Signed)
 Improved with new housing situation and no sinus infection lately.

## 2024-03-29 ENCOUNTER — Ambulatory Visit: Payer: Self-pay | Admitting: Internal Medicine

## 2024-04-01 ENCOUNTER — Other Ambulatory Visit: Payer: Self-pay | Admitting: Internal Medicine

## 2024-04-01 DIAGNOSIS — E119 Type 2 diabetes mellitus without complications: Secondary | ICD-10-CM

## 2024-04-10 ENCOUNTER — Telehealth: Payer: Self-pay | Admitting: *Deleted

## 2024-04-10 NOTE — Progress Notes (Signed)
 Care Guide Pharmacy Note  04/10/2024 Name: Brett Soza MRN: [id number] DOB: [birth year]  Referred By: Rollene Almarie LABOR, MD Reason for referral: Call Attempt #1 and Complex Care Management (Outreach to schedule referral with pharmacist )   Sally Kidd is a 58 y.o. year old female who is a primary care patient of Rollene Almarie LABOR, MD.  [name] was referred to the pharmacist for assistance related to: DMII  An unsuccessful telephone outreach was attempted today to contact the patient who was referred to the pharmacy team for assistance with medication management. Additional attempts will be made to contact the patient.  Thedford Franks, CMA [place]  [facility], [facility] Guide Direct Dial: [phone]  Fax: [phone] Website: [email or web address]

## 2024-04-11 NOTE — Progress Notes (Signed)
 Care Guide Pharmacy Note  04/11/2024 Name: Sally Kidd MRN: [id number] DOB: Mar 01, 1966  Referred By: Rollene Almarie LABOR, MD Reason for referral: Call Attempt #1 and Complex Care Management (Outreach to schedule referral with pharmacist )   Sally Kidd is a 58 y.o. year old female who is a primary care patient of Rollene Almarie LABOR, MD.  Sally Kidd was referred to the pharmacist for assistance related to: DMII  A second unsuccessful telephone outreach was attempted today to contact the patient who was referred to the pharmacy team for assistance with medication management. Additional attempts will be made to contact the patient.  Thedford Franks, CMA [place]  [facility], [facility] Guide Direct Dial: [phone]  Fax: [phone] Website: [email or web address]

## 2024-04-12 NOTE — Progress Notes (Signed)
 Care Guide Pharmacy Note  04/12/2024 Name: Sally Kidd MRN: [id number] DOB: [birth year]  Referred By: Rollene Almarie LABOR, MD Reason for referral: Call Attempt #1 and Complex Care Management (Outreach to schedule referral with pharmacist )   [name] is a 58 y.o. year old female who is a primary care patient of Rollene Almarie LABOR, MD.  [name] was referred to the pharmacist for assistance related to: DMII  A third unsuccessful telephone outreach was attempted today to contact the patient who was referred to the pharmacy team for assistance with medication management. The Population Health team is pleased to engage with this patient at any time in the future upon receipt of referral and should he/she be interested in assistance from the Population Health team.  Thedford Franks, CMA [facility]  [facility], [facility] Guide Direct Dial: [phone]  Fax: [phone] Website: [email or web address]

## 2024-06-04 ENCOUNTER — Other Ambulatory Visit: Payer: Self-pay | Admitting: Internal Medicine

## 2024-06-04 ENCOUNTER — Ambulatory Visit: Payer: Self-pay

## 2024-06-04 DIAGNOSIS — Z9109 Other allergy status, other than to drugs and biological substances: Secondary | ICD-10-CM

## 2024-06-04 MED ORDER — MONTELUKAST SODIUM 10 MG PO TABS: 10.0000 mg | ORAL_TABLET | Freq: Every day | ORAL | 3 refills | Status: DC

## 2024-06-04 NOTE — Telephone Encounter (Signed)
 FYI Only or Action Required?: Action required by provider: update on patient condition.  Patient was last seen in primary care on 03/25/2024 by Rollene Almarie LABOR, MD.  Called Nurse Triage reporting Nasal Congestion.  Symptoms began several days ago.  Triage Disposition: See Physician Within 24 Hours (overriding See PCP When Office is Open (Within 3 Days))  Patient/caregiver understands and will follow disposition?: Yes                     Reason for Disposition  Lots of coughing  Answer Assessment - Initial Assessment Questions This RN scheduled pt for an appointment tomorrow in office. Pt is requesting something to be prescribed for her below symptoms today to help her through the night. Pt would like a call back today and requests a voicemail to be left if no answer. This RN advised pt to go to urgent care this evening if needed but pt declines.  [facility] [place], [place] - [address] [facility] [address] [address] [place] [address] Phone: [phone]  Fax: [phone]  Started this weekend Scratchy throat, sore throat during day Productive cough Phlegm at roof of mouth- white in color Nasal congestion Headache Symptoms are not as bad as they were; symptoms worse at night Denies difficulty breathing  FEVER: Do you have a fever? If Yes, ask: What is it, how was it measured, and when did it start?      Could have; not currently  Protocols used: Sinus Pain or Congestion-A-AH

## 2024-06-04 NOTE — Telephone Encounter (Signed)
 2nd attempt, phone call attempt went straight to voicemail. Left message for patient to return call for nurse triage.     Copied from CRM [id number]. Topic: Clinical - Medication Question >> Jun 04, 2024 12:41 PM Suzen RAMAN wrote: Reason for CRM: Patient would like to know if she can be prescribed promethazine -dextromethorphan (PROMETHAZINE -DM) 6.25-15 MG/5ML syrup . Patient has tried OTC products that has not helped.    CB#[phone] (M)-okay to LVM if unable to reach patient

## 2024-06-04 NOTE — Telephone Encounter (Signed)
 Copied from CRM #[id number]. Topic: Clinical - Medication Refill >> Jun 04, 2024 12:42 PM Suzen RAMAN wrote: Medication: montelukast  (SINGULAIR ) 10 MG tablet   Has the patient contacted their pharmacy? Yes   This is the patient's preferred pharmacy:  [facility] [place], [place] - [address] [facility] [address] [place] [address] Phone: [phone] Fax: [phone]  Is this the correct pharmacy for this prescription? Yes If no, delete pharmacy and type the correct one.   Has the prescription been filled recently? No  Is the patient out of the medication? No  Has the patient been seen for an appointment in the last year OR does the patient have an upcoming appointment? Yes  Can we respond through MyChart? Yes  Agent: Please be advised that Rx refills may take up to 3 business days. We ask that you follow-up with your pharmacy.

## 2024-06-04 NOTE — Telephone Encounter (Signed)
 This RN attempted to contact patient (3rd attempt). No answer. LVM. Will forward to office for follow up.

## 2024-06-04 NOTE — Telephone Encounter (Signed)
 First attempt: LVM for patient to return call to [phone]   Copied from CRM #[id number]. Topic: Clinical - Medication Question >> Jun 04, 2024 12:41 PM Suzen RAMAN wrote: Reason for CRM: Patient would like to know if she can be prescribed promethazine -dextromethorphan (PROMETHAZINE -DM) 6.25-15 MG/5ML syrup . Patient has tried OTC products that has not helped.   CB#[phone] (M)-okay to LVM if unable to reach patient

## 2024-06-05 ENCOUNTER — Encounter: Payer: Self-pay | Admitting: Internal Medicine

## 2024-06-05 ENCOUNTER — Ambulatory Visit (INDEPENDENT_AMBULATORY_CARE_PROVIDER_SITE_OTHER): Admitting: Internal Medicine

## 2024-06-05 VITALS — BP 116/80 | HR 86 | Temp 98.5°F | Ht 67.0 in | Wt 154.0 lb

## 2024-06-05 DIAGNOSIS — J019 Acute sinusitis, unspecified: Secondary | ICD-10-CM

## 2024-06-05 MED ORDER — AMOXICILLIN-POT CLAVULANATE 875-125 MG PO TABS: 1.0000 | ORAL_TABLET | Freq: Two times a day (BID) | ORAL | 0 refills | Status: AC

## 2024-06-05 MED ORDER — PROMETHAZINE-DM 6.25-15 MG/5ML PO SYRP: 5.0000 mL | ORAL_SOLUTION | Freq: Four times a day (QID) | ORAL | 0 refills | Status: DC | PRN

## 2024-06-05 NOTE — Telephone Encounter (Signed)
 Would recommend otc medications for what sounds like cold symptoms.

## 2024-06-05 NOTE — Patient Instructions (Addendum)
       Medications changes include :   Augmentin and cough syrup      Return if symptoms worsen or fail to improve.

## 2024-06-05 NOTE — Progress Notes (Signed)
 Subjective:    Patient ID: Sally Kidd, female    DOB: 06-18-1966, 58 y.o.   MRN: [id number]      HPI Sally Kidd is here for  Chief Complaint  Patient presents with   Sinus Problem    Since last Friday. Roof of her mouth has been burning.Pt has tried OTC medication and nothing to seem to help, pt has tried BC powder, nasal spray etc.    Symptoms started Friday night.  She has a history of sinus infections.  She states fatigue, nasal congestion, ear pain and clogged sensation, PND, sinus pressure, sore throat, cough related to the drainage, headaches and some lightheadedness.  She states some shortness of breath, but relates it to having to talk a lot at work.    Advil  cold and sinus.  Taking singular, flonase.  She has been gargling with hydrogen peroxide and Listerine.  She did have 2 antibiotic pills leftover from a recent sinus infection and she did take those and she thinks that has helped   Medications and allergies reviewed with patient and updated if appropriate.  Current Outpatient Medications on File Prior to Visit  Medication Sig Dispense Refill   Aspirin-Caffeine 845-65 MG PACK Take by mouth as needed (back pain).     cetirizine (ZYRTEC) 10 MG tablet Take 20 mg by mouth as needed. Reported on 04/04/2016     diphenhydrAMINE (BENADRYL) 25 MG tablet Take 25 mg by mouth every 6 (six) hours as needed. Reported on 04/04/2016     ibuprofen  (ADVIL ) 600 MG tablet Take 1 tablet (600 mg total) by mouth every 8 (eight) hours as needed. 30 tablet 0   lidocaine  (XYLOCAINE ) 2 % solution Use as directed 10 mLs in the mouth or throat every 4 (four) hours as needed for mouth pain. 100 mL 0   MINOXIDIL , TOPICAL, 5 % SOLN Use topically daily 100 mL 11   Misc Natural Products (COLON CLEANSER PO) Take 3 capsules by mouth once a week.     montelukast  (SINGULAIR ) 10 MG tablet Take 1 tablet (10 mg total) by mouth at bedtime. 90 tablet 3   Multiple Vitamins-Minerals (MULTIVITAMIN) tablet  Take 1 tablet by mouth daily. 90 tablet 3   Olopatadine  HCl 0.2 % SOLN Apply 2 drops to eye 2 (two) times daily as needed (allergies, itching eye). 5 mL 11   PARoxetine  (PAXIL ) 10 MG tablet Take 1 tablet (10 mg total) by mouth every morning. 90 tablet 3   pravastatin  (PRAVACHOL ) 20 MG tablet Take 1 tablet (20 mg total) by mouth daily. 90 tablet 3   traMADol  (ULTRAM ) 50 MG tablet TAKE 1 TABLET (50MG ) BY MOUTH EVERY 6 HOURS AS NEEDED. (Patient taking differently: 50 mg daily. Take 4-5 per day for pain) 100 tablet 0   No current facility-administered medications on file prior to visit.    Review of Systems  Constitutional:  Positive for fatigue. Negative for chills and fever.  HENT:  Positive for congestion, ear pain, postnasal drip, sinus pressure and sore throat.   Respiratory:  Positive for cough (PND) and shortness of breath (from talking all day). Negative for wheezing.   Gastrointestinal:  Negative for nausea.  Musculoskeletal:        Achiness  Neurological:  Positive for light-headedness and headaches (frontal). Negative for dizziness.       Objective:   Vitals:   06/05/24 1543  BP: 116/80  Pulse: 86  Temp: 98.5 F (36.9 C)  SpO2: 96%   BP Readings  from Last 3 Encounters:  06/05/24 116/80  03/25/24 118/88  07/10/23 112/80   Wt Readings from Last 3 Encounters:  06/05/24 154 lb (69.9 kg)  03/25/24 151 lb (68.5 kg)  07/10/23 145 lb (65.8 kg)   Body mass index is 24.12 kg/m.    Physical Exam Constitutional:      General: She is not in acute distress.    Appearance: Normal appearance. She is not ill-appearing.  HENT:     Head: Normocephalic and atraumatic.     Right Ear: Tympanic membrane, ear canal and external ear normal.     Left Ear: Tympanic membrane, ear canal and external ear normal.     Mouth/Throat:     Mouth: Mucous membranes are moist.     Pharynx: No oropharyngeal exudate or posterior oropharyngeal erythema.  Eyes:     Conjunctiva/sclera:  Conjunctivae normal.  Cardiovascular:     Rate and Rhythm: Normal rate and regular rhythm.  Pulmonary:     Effort: Pulmonary effort is normal. No respiratory distress.     Breath sounds: Normal breath sounds. No wheezing or rales.  Musculoskeletal:     Cervical back: Neck supple. No tenderness.  Lymphadenopathy:     Cervical: No cervical adenopathy.  Skin:    General: Skin is warm and dry.  Neurological:     Mental Status: She is alert.            Assessment & Plan:    Acute sinus infection: Acute Concern for bacterial infection Start Augmentin  875-125 mg BID x 7 day Promethazine -DM cough syrup otc cold medications and allergy medications Rest, fluid Call if no improvement

## 2024-06-14 ENCOUNTER — Ambulatory Visit: Payer: Self-pay

## 2024-06-14 ENCOUNTER — Telehealth: Admitting: Physician Assistant

## 2024-06-14 DIAGNOSIS — J019 Acute sinusitis, unspecified: Secondary | ICD-10-CM

## 2024-06-14 DIAGNOSIS — B9689 Other specified bacterial agents as the cause of diseases classified elsewhere: Secondary | ICD-10-CM

## 2024-06-14 MED ORDER — DOXYCYCLINE HYCLATE 100 MG PO TABS: 100.0000 mg | ORAL_TABLET | Freq: Two times a day (BID) | ORAL | 0 refills | Status: AC

## 2024-06-14 MED ORDER — FLUTICASONE PROPIONATE 50 MCG/ACT NA SUSP: 2.0000 | Freq: Every day | NASAL | 0 refills | Status: DC

## 2024-06-14 NOTE — Progress Notes (Signed)
 Virtual Visit Consent   Sally Kidd, you are scheduled for a virtual visit with a [facility] provider today. Just as with appointments in the office, your consent must be obtained to participate. Your consent will be active for this visit and any virtual visit you may have with one of our providers in the next 365 days. If you have a MyChart account, a copy of this consent can be sent to you electronically.  As this is a virtual visit, video technology does not allow for your provider to perform a traditional examination. This may limit your provider's ability to fully assess your condition. If your provider identifies any concerns that need to be evaluated in person or the need to arrange testing (such as labs, EKG, etc.), we will make arrangements to do so. Although advances in technology are sophisticated, we cannot ensure that it will always work on either your end or our end. If the connection with a video visit is poor, the visit may have to be switched to a telephone visit. With either a video or telephone visit, we are not always able to ensure that we have a secure connection.  By engaging in this virtual visit, you consent to the provision of healthcare and authorize for your insurance to be billed (if applicable) for the services provided during this visit. Depending on your insurance coverage, you may receive a charge related to this service.  I need to obtain your verbal consent now. Are you willing to proceed with your visit today? Sally Kidd has provided verbal consent on 06/14/2024 for a virtual visit (video or telephone). Sally CHRISTELLA Dickinson, PA-C  Date: 06/14/2024 7:44 PM   Virtual Visit via Video Note   I, Sally Kidd, connected with  Sally Kidd  ([id number], January 30, 1966) on 06/14/24 at  7:30 PM EDT by a video-enabled telemedicine application and verified that I am speaking with the correct person using two identifiers.  Location: Patient: Virtual  Visit Location Patient: Home Provider: Virtual Visit Location Provider: Home Office   I discussed the limitations of evaluation and management by telemedicine and the availability of in person appointments. The patient expressed understanding and agreed to proceed.    History of Present Illness: Sally Kidd is a 58 y.o. who identifies as a female who was assigned female at birth, and is being seen today for continued sinus congestion and pain worsening.  HPI: Sinusitis This is a new problem. The current episode started 1 to 4 weeks ago (Seen on 06/05/24 and given Augmentin  but did not take as directed; worsening again over the last 3 days). The problem has been gradually worsening since onset. Maximum temperature: subjective. The pain is moderate. Associated symptoms include chills, congestion, ear pain, headaches and sinus pressure. (Post nasal drainage that is thick, dizziness, hot flashes) Treatments tried: advil  cold and sinus, BC powder, augmentin . The treatment provided no relief.     Problems:  Patient Active Problem List   Diagnosis Date Noted   Dental infection 03/28/2024   Pre-diabetes 07/13/2023   Blood in stool 07/13/2023   Fatigue 08/12/2022   Non-seasonal allergic rhinitis 05/04/2022   Opioid dependence (HCC) 09/04/2020   Environmental allergies 09/04/2020   Hyperlipidemia 03/29/2019   Benign skin cyst 09/25/2018   Routine general medical examination at a health care facility 04/03/2015   Back pain 10/14/2014   Insomnia 10/14/2014    Allergies: No Known Allergies Medications:  Current Outpatient Medications:    doxycycline  (VIBRA -TABS) 100 MG tablet, Take 1  tablet (100 mg total) by mouth 2 (two) times daily., Disp: 14 tablet, Rfl: 0   fluticasone  (FLONASE ) 50 MCG/ACT nasal spray, Place 2 sprays into both nostrils daily., Disp: 16 g, Rfl: 0   Aspirin-Caffeine 845-65 MG PACK, Take by mouth as needed (back pain)., Disp: , Rfl:    cetirizine (ZYRTEC) 10 MG tablet,  Take 20 mg by mouth as needed. Reported on 04/04/2016, Disp: , Rfl:    diphenhydrAMINE (BENADRYL) 25 MG tablet, Take 25 mg by mouth every 6 (six) hours as needed. Reported on 04/04/2016, Disp: , Rfl:    ibuprofen  (ADVIL ) 600 MG tablet, Take 1 tablet (600 mg total) by mouth every 8 (eight) hours as needed., Disp: 30 tablet, Rfl: 0   lidocaine  (XYLOCAINE ) 2 % solution, Use as directed 10 mLs in the mouth or throat every 4 (four) hours as needed for mouth pain., Disp: 100 mL, Rfl: 0   MINOXIDIL , TOPICAL, 5 % SOLN, Use topically daily, Disp: 100 mL, Rfl: 11   Misc Natural Products (COLON CLEANSER PO), Take 3 capsules by mouth once a week., Disp: , Rfl:    montelukast  (SINGULAIR ) 10 MG tablet, Take 1 tablet (10 mg total) by mouth at bedtime., Disp: 90 tablet, Rfl: 3   Multiple Vitamins-Minerals (MULTIVITAMIN) tablet, Take 1 tablet by mouth daily., Disp: 90 tablet, Rfl: 3   Olopatadine  HCl 0.2 % SOLN, Apply 2 drops to eye 2 (two) times daily as needed (allergies, itching eye)., Disp: 5 mL, Rfl: 11   PARoxetine  (PAXIL ) 10 MG tablet, Take 1 tablet (10 mg total) by mouth every morning., Disp: 90 tablet, Rfl: 3   pravastatin  (PRAVACHOL ) 20 MG tablet, Take 1 tablet (20 mg total) by mouth daily., Disp: 90 tablet, Rfl: 3   promethazine -dextromethorphan (PROMETHAZINE -DM) 6.25-15 MG/5ML syrup, Take 5 mLs by mouth 4 (four) times daily as needed., Disp: 118 mL, Rfl: 0   traMADol  (ULTRAM ) 50 MG tablet, TAKE 1 TABLET (50MG ) BY MOUTH EVERY 6 HOURS AS NEEDED. (Patient taking differently: 50 mg daily. Take 4-5 per day for pain), Disp: 100 tablet, Rfl: 0  Observations/Objective: Patient is well-developed, well-nourished in no acute distress.  Resting comfortably at home.  Head is normocephalic, atraumatic.  No labored breathing.  Speech is clear and coherent with logical content.  Patient is alert and oriented at baseline.    Assessment and Plan: 1. Acute bacterial sinusitis (Primary) - doxycycline  (VIBRA -TABS) 100 MG  tablet; Take 1 tablet (100 mg total) by mouth 2 (two) times daily.  Dispense: 14 tablet; Refill: 0 - fluticasone  (FLONASE ) 50 MCG/ACT nasal spray; Place 2 sprays into both nostrils daily.  Dispense: 16 g; Refill: 0  - Worsening symptoms that have not responded to OTC medications.  - Will give Doxycycline ; STOP Augmentin  - Add Flonase  for drainage and ETD - May continue Alka Seltzer Cold and Flu - Can use Promethazine  for nausea that she already has on hand at home - Continue allergy medications.  - Steam and humidifier can help - Stay well hydrated and get plenty of rest.  - Seek in person evaluation if no symptom improvement or if symptoms worsen   Follow Up Instructions: I discussed the assessment and treatment plan with the patient. The patient was provided an opportunity to ask questions and all were answered. The patient agreed with the plan and demonstrated an understanding of the instructions.  A copy of instructions were sent to the patient via MyChart unless otherwise noted below.    The patient was advised to  call back or seek an in-person evaluation if the symptoms worsen or if the condition fails to improve as anticipated.    Sally CHRISTELLA Dickinson, PA-C

## 2024-06-14 NOTE — Patient Instructions (Signed)
 Sally Kidd, thank you for joining Sally Kidd CHRISTELLA Dickinson, PA-C for today's virtual visit.  While this provider is not your primary care provider (PCP), if your PCP is located in our provider database this encounter information will be shared with them immediately following your visit.   A [facility] MyChart account gives you access to today's visit and all your visits, tests, and labs performed at [facility]  click here if you don't have a [facility] MyChart account or go to [email or web address]  Consent: (Patient) Sally Kidd provided verbal consent for this virtual visit at the beginning of the encounter.  Current Medications:  Current Outpatient Medications:    doxycycline  (VIBRA -TABS) 100 MG tablet, Take 1 tablet (100 mg total) by mouth 2 (two) times daily., Disp: 14 tablet, Rfl: 0   fluticasone  (FLONASE ) 50 MCG/ACT nasal spray, Place 2 sprays into both nostrils daily., Disp: 16 g, Rfl: 0   Aspirin-Caffeine 845-65 MG PACK, Take by mouth as needed (back pain)., Disp: , Rfl:    cetirizine (ZYRTEC) 10 MG tablet, Take 20 mg by mouth as needed. Reported on 04/04/2016, Disp: , Rfl:    diphenhydrAMINE (BENADRYL) 25 MG tablet, Take 25 mg by mouth every 6 (six) hours as needed. Reported on 04/04/2016, Disp: , Rfl:    ibuprofen  (ADVIL ) 600 MG tablet, Take 1 tablet (600 mg total) by mouth every 8 (eight) hours as needed., Disp: 30 tablet, Rfl: 0   lidocaine  (XYLOCAINE ) 2 % solution, Use as directed 10 mLs in the mouth or throat every 4 (four) hours as needed for mouth pain., Disp: 100 mL, Rfl: 0   MINOXIDIL , TOPICAL, 5 % SOLN, Use topically daily, Disp: 100 mL, Rfl: 11   Misc Natural Products (COLON CLEANSER PO), Take 3 capsules by mouth once a week., Disp: , Rfl:    montelukast  (SINGULAIR ) 10 MG tablet, Take 1 tablet (10 mg total) by mouth at bedtime., Disp: 90 tablet, Rfl: 3   Multiple Vitamins-Minerals (MULTIVITAMIN) tablet, Take 1 tablet by mouth daily., Disp: 90  tablet, Rfl: 3   Olopatadine  HCl 0.2 % SOLN, Apply 2 drops to eye 2 (two) times daily as needed (allergies, itching eye)., Disp: 5 mL, Rfl: 11   PARoxetine  (PAXIL ) 10 MG tablet, Take 1 tablet (10 mg total) by mouth every morning., Disp: 90 tablet, Rfl: 3   pravastatin  (PRAVACHOL ) 20 MG tablet, Take 1 tablet (20 mg total) by mouth daily., Disp: 90 tablet, Rfl: 3   promethazine -dextromethorphan (PROMETHAZINE -DM) 6.25-15 MG/5ML syrup, Take 5 mLs by mouth 4 (four) times daily as needed., Disp: 118 mL, Rfl: 0   traMADol  (ULTRAM ) 50 MG tablet, TAKE 1 TABLET (50MG ) BY MOUTH EVERY 6 HOURS AS NEEDED. (Patient taking differently: 50 mg daily. Take 4-5 per day for pain), Disp: 100 tablet, Rfl: 0   Medications ordered in this encounter:  Meds ordered this encounter  Medications   doxycycline  (VIBRA -TABS) 100 MG tablet    Sig: Take 1 tablet (100 mg total) by mouth 2 (two) times daily.    Dispense:  14 tablet    Refill:  0    Supervising Provider:   LAMPTEY, PHILIP O [[id number]]   fluticasone  (FLONASE ) 50 MCG/ACT nasal spray    Sig: Place 2 sprays into both nostrils daily.    Dispense:  16 g    Refill:  0    Supervising Provider:   [name] [id number]     *If you need refills on other medications prior to your next appointment, please contact  your pharmacy*  Follow-Up: Call back or seek an in-person evaluation if the symptoms worsen or if the condition fails to improve as anticipated.   Virtual Care [phone]  Other Instructions Sinus Infection, Adult A sinus infection, also called sinusitis, is inflammation of your sinuses. Sinuses are hollow spaces in the bones around your face. Your sinuses are located: Around your eyes. In the middle of your forehead. Behind your nose. In your cheekbones. Mucus normally drains out of your sinuses. When your nasal tissues become inflamed or swollen, mucus can become trapped or blocked. This allows bacteria, viruses, and fungi to grow,  which leads to infection. Most infections of the sinuses are caused by a virus. A sinus infection can develop quickly. It can last for up to 4 weeks (acute) or for more than 12 weeks (chronic). A sinus infection often develops after a cold. What are the causes? This condition is caused by anything that creates swelling in the sinuses or stops mucus from draining. This includes: Allergies. Asthma. Infection from bacteria or viruses. Deformities or blockages in your nose or sinuses. Abnormal growths in the nose (nasal polyps). Pollutants, such as chemicals or irritants in the air. Infection from fungi. This is rare. What increases the risk? You are more likely to develop this condition if you: Have a weak body defense system (immune system). Do a lot of swimming or diving. Overuse nasal sprays. Smoke. What are the signs or symptoms? The main symptoms of this condition are pain and a feeling of pressure around the affected sinuses. Other symptoms include: Stuffy nose or congestion that makes it difficult to breathe through your nose. Thick yellow or greenish drainage from your nose. Tenderness, swelling, and warmth over the affected sinuses. A cough that may get worse at night. Decreased sense of smell and taste. Extra mucus that collects in the throat or the back of the nose (postnasal drip) causing a sore throat or bad breath. Tiredness (fatigue). Fever. How is this diagnosed? This condition is diagnosed based on: Your symptoms. Your medical history. A physical exam. Tests to find out if your condition is acute or chronic. This may include: Checking your nose for nasal polyps. Viewing your sinuses using a device that has a light (endoscope). Testing for allergies or bacteria. Imaging tests, such as an MRI or CT scan. In rare cases, a bone biopsy may be done to rule out more serious types of fungal sinus disease. How is this treated? Treatment for a sinus infection depends on the  cause and whether your condition is chronic or acute. If caused by a virus, your symptoms should go away on their own within 10 days. You may be given medicines to relieve symptoms. They include: Medicines that shrink swollen nasal passages (decongestants). A spray that eases inflammation of the nostrils (topical intranasal corticosteroids). Rinses that help get rid of thick mucus in your nose (nasal saline washes). Medicines that treat allergies (antihistamines). Over-the-counter pain relievers. If caused by bacteria, your health care provider may recommend waiting to see if your symptoms improve. Most bacterial infections will get better without antibiotic medicine. You may be given antibiotics if you have: A severe infection. A weak immune system. If caused by narrow nasal passages or nasal polyps, surgery may be needed. Follow these instructions at home: Medicines Take, use, or apply over-the-counter and prescription medicines only as told by your health care provider. These may include nasal sprays. If you were prescribed an antibiotic medicine, take it as told  by your health care provider. Do not stop taking the antibiotic even if you start to feel better. Hydrate and humidify  Drink enough fluid to keep your urine pale yellow. Staying hydrated will help to thin your mucus. Use a cool mist humidifier to keep the humidity level in your home above 50%. Inhale steam for 10-15 minutes, 3-4 times a day, or as told by your health care provider. You can do this in the bathroom while a hot shower is running. Limit your exposure to cool or dry air. Rest Rest as much as possible. Sleep with your head raised (elevated). Make sure you get enough sleep each night. General instructions  Apply a warm, moist washcloth to your face 3-4 times a day or as told by your health care provider. This will help with discomfort. Use nasal saline washes as often as told by your health care provider. Wash your  hands often with soap and water to reduce your exposure to germs. If soap and water are not available, use hand sanitizer. Do not smoke. Avoid being around people who are smoking (secondhand smoke). Keep all follow-up visits. This is important. Contact a health care provider if: You have a fever. Your symptoms get worse. Your symptoms do not improve within 10 days. Get help right away if: You have a severe headache. You have persistent vomiting. You have severe pain or swelling around your face or eyes. You have vision problems. You develop confusion. Your neck is stiff. You have trouble breathing. These symptoms may be an emergency. Get help right away. Call 911. Do not wait to see if the symptoms will go away. Do not drive yourself to the hospital. Summary A sinus infection is soreness and inflammation of your sinuses. Sinuses are hollow spaces in the bones around your face. This condition is caused by nasal tissues that become inflamed or swollen. The swelling traps or blocks the flow of mucus. This allows bacteria, viruses, and fungi to grow, which leads to infection. If you were prescribed an antibiotic medicine, take it as told by your health care provider. Do not stop taking the antibiotic even if you start to feel better. Keep all follow-up visits. This is important. This information is not intended to replace advice given to you by your health care provider. Make sure you discuss any questions you have with your health care provider. Document Revised: 08/24/2021 Document Reviewed: 08/24/2021 Elsevier Patient Education  2024 [employer].   If you have been instructed to have an in-person evaluation today at a local Urgent Care facility, please use the link below. It will take you to a list of all of our available [facility], including address, phone number and hours of operation. Please do not delay care.  [facility]  If you or a family member do  not have a primary care provider, use the link below to schedule a visit and establish care. When you choose a [facility] primary care physician or advanced practice provider, you gain a long-term partner in health. Find a Primary Care Provider  Learn more about [facility]'s in-office and virtual care options: [facility] - Get Care Now

## 2024-06-14 NOTE — Telephone Encounter (Signed)
 FYI Only or Action Required?: FYI only for provider.  Patient was last seen in primary care on 06/05/2024 by Geofm Glade PARAS, MD.  Called Nurse Triage reporting Facial Pain.  Symptoms began several days ago.  Interventions attempted: Prescription medications: Augmentin  (prescribed 06/05/2024-didn't take as prescribed) and Rest, hydration, or home remedies.  Symptoms are: unchanged.  Triage Disposition: See HCP Within 4 Hours (Or PCP Triage)  Patient/caregiver understands and will follow disposition?: Yes  Copied from CRM [id number]. Topic: Clinical - Red Word Triage >> Jun 14, 2024  4:35 PM Roselie BROCKS wrote: [facility] that prompted transfer to Nurse Triage: Patient feels she has a major sinus infection, patient states she is in extreme pain,weak,dizzy, very tired feeling Reason for Disposition  [1] Redness or swelling on the cheek, forehead or around the eye AND [2] no fever  Answer Assessment - Initial Assessment Questions 1. LOCATION: Where does it hurt?      Sinus area and pain to nasal area 2. ONSET: When did the sinus pain start?  (e.g., hours, days)      Started Labor Day weekend 3. SEVERITY: How bad is the pain?   (Scale 0-10; or none, mild, moderate or severe)     9 out of 10 4. RECURRENT SYMPTOM: Have you ever had sinus problems before? If Yes, ask: When was the last time? and What happened that time?      yes 5. NASAL CONGESTION: Is the nose blocked? If Yes, ask: Can you open it or must you breathe through your mouth?     Yes-has to breath out of her mouth 6. NASAL DISCHARGE: Do you have discharge from your nose? If so ask, What color?     White discharge from nose 7. FEVER: Do you have a fever? If Yes, ask: What is it, how was it measured, and when did it start?      Patient is unsure 8. OTHER SYMPTOMS: Do you have any other symptoms? (e.g., sore throat, cough, earache, difficulty breathing)     Fatigue, burning of the throat  Patient was seen on  06/05/2024-given prescriptions for Augmentin  and cough medication. During triage, patient did state she had leftover Augmentin  and hadn't been taking it as prescribed. Patient asked for a virtual appointment-virtual appointment scheduled for today at 7:30 PM  Protocols used: Sinus Pain or Congestion-A-AH

## 2024-06-26 ENCOUNTER — Other Ambulatory Visit: Payer: Self-pay | Admitting: Internal Medicine

## 2024-07-24 ENCOUNTER — Other Ambulatory Visit: Payer: Self-pay | Admitting: Internal Medicine

## 2024-07-24 DIAGNOSIS — Z1231 Encounter for screening mammogram for malignant neoplasm of breast: Secondary | ICD-10-CM

## 2024-08-13 ENCOUNTER — Ambulatory Visit
Admission: RE | Admit: 2024-08-13 | Discharge: 2024-08-13 | Disposition: A | Discharge status: Home / Self Care | Source: Ambulatory Visit | Attending: Internal Medicine | Admitting: Internal Medicine

## 2024-08-13 DIAGNOSIS — Z1231 Encounter for screening mammogram for malignant neoplasm of breast: Secondary | ICD-10-CM

## 2024-08-16 ENCOUNTER — Ambulatory Visit: Payer: Self-pay | Admitting: Internal Medicine

## 2024-10-01 ENCOUNTER — Encounter: Payer: Self-pay | Admitting: Internal Medicine

## 2024-10-01 ENCOUNTER — Telehealth: Payer: Self-pay

## 2024-10-01 ENCOUNTER — Ambulatory Visit: Admitting: Internal Medicine

## 2024-10-01 VITALS — BP 102/72 | HR 85 | Temp 98.9°F | Ht 67.0 in | Wt 151.2 lb

## 2024-10-01 DIAGNOSIS — E782 Mixed hyperlipidemia: Secondary | ICD-10-CM | POA: Diagnosis not present

## 2024-10-01 DIAGNOSIS — Z9109 Other allergy status, other than to drugs and biological substances: Secondary | ICD-10-CM | POA: Diagnosis not present

## 2024-10-01 DIAGNOSIS — R7303 Prediabetes: Secondary | ICD-10-CM | POA: Diagnosis not present

## 2024-10-01 DIAGNOSIS — K5909 Other constipation: Secondary | ICD-10-CM | POA: Insufficient documentation

## 2024-10-01 DIAGNOSIS — R232 Flushing: Secondary | ICD-10-CM

## 2024-10-01 DIAGNOSIS — M545 Low back pain, unspecified: Secondary | ICD-10-CM | POA: Diagnosis not present

## 2024-10-01 DIAGNOSIS — G8929 Other chronic pain: Secondary | ICD-10-CM

## 2024-10-01 LAB — POCT GLYCOSYLATED HEMOGLOBIN (HGB A1C): Hemoglobin A1C: 5.9 % — AB (ref 4.0–5.6)

## 2024-10-01 MED ORDER — LINACLOTIDE 72 MCG PO CAPS: 72.0000 ug | ORAL_CAPSULE | Freq: Every day | ORAL | 3 refills | Status: AC

## 2024-10-01 MED ORDER — MONTELUKAST SODIUM 10 MG PO TABS: 10.0000 mg | ORAL_TABLET | Freq: Every day | ORAL | 1 refills | Status: AC

## 2024-10-01 MED ORDER — KETOROLAC TROMETHAMINE 30 MG/ML IJ SOLN
30.0000 mg | Freq: Once | INTRAMUSCULAR | Status: AC
  Administered 2024-10-01: 30 mg via INTRAMUSCULAR

## 2024-10-01 MED ORDER — METHYLPREDNISOLONE ACETATE 40 MG/ML IJ SUSP
40.0000 mg | Freq: Once | INTRAMUSCULAR | Status: AC
  Administered 2024-10-01: 40 mg via INTRAMUSCULAR

## 2024-10-01 MED ORDER — PAROXETINE HCL 20 MG PO TABS: 20.0000 mg | ORAL_TABLET | Freq: Every morning | ORAL | 3 refills | Status: AC

## 2024-10-01 MED ORDER — PROMETHAZINE-DM 6.25-15 MG/5ML PO SYRP: 5.0000 mL | ORAL_SOLUTION | Freq: Four times a day (QID) | ORAL | 0 refills | Status: AC | PRN

## 2024-10-01 MED ORDER — FLUTICASONE PROPIONATE 50 MCG/ACT NA SUSP: 2.0000 | Freq: Every day | NASAL | 3 refills | Status: AC

## 2024-10-01 NOTE — Progress Notes (Signed)
 "  Subjective:   Patient ID: Sally Kidd, female    DOB: Oct 05, 1965, 58 y.o.   MRN: [id number]  Discussed the use of AI scribe software for clinical note transcription with the patient, who gave verbal consent to proceed.  History of Present Illness Sally Kidd is a 58 year old female who presents for follow-up and management of her prediabetes and chronic pain.  She has experienced a significant improvement in her blood sugar levels, with her A1c decreasing from 6.5% to 5.9% since her last visit. She reports that since her recent move, she has been able to cook more at home and avoid eating out. She mentions a reduction in stress and an overall improvement in her mental state, which she believes has contributed to her better health outcomes. She has also lost weight since her move.  She has a history of elevated cholesterol levels and stopped taking her cholesterol medication about a year ago due to side effects. She stopped taking her cholesterol medication about a year ago due to side effects. Her most recent cholesterol labs showed a total cholesterol of 266 mg/dL and LDL of 845 mg/dL.  She experiences chronic pain, particularly sciatica on the left side, and is under the care of Dr. Bonner for pain management. She is on a pain management regimen that she feels is effective, but she requests a shot for additional relief as previous injections did not provide the desired effect.  She has been experiencing sinus issues, particularly post-nasal drip causing throat itchiness, and has been using over-the-counter medications like Benadryl for relief. She inquires about refilling her sinus medications.  She is on paroxetine  10 mg for hot flash management and has noticed increased symptoms, possibly due to seasonal changes. She mentions experiencing hot flashes and difficulty sleeping, despite using temperature-regulating sheets.  Review of Systems  Constitutional: Negative.   HENT:  Negative.    Eyes: Negative.   Respiratory:  Negative for cough, chest tightness and shortness of breath.   Cardiovascular:  Negative for chest pain, palpitations and leg swelling.  Gastrointestinal:  Negative for abdominal distention, abdominal pain, constipation, diarrhea, nausea and vomiting.  Endocrine: Positive for heat intolerance.  Musculoskeletal:  Positive for arthralgias, back pain and myalgias.  Skin: Negative.   Neurological: Negative.   Psychiatric/Behavioral: Negative.      Objective:  Physical Exam Constitutional:      Appearance: She is well-developed.  HENT:     Head: Normocephalic and atraumatic.  Cardiovascular:     Rate and Rhythm: Normal rate and regular rhythm.  Pulmonary:     Effort: Pulmonary effort is normal. No respiratory distress.     Breath sounds: Normal breath sounds. No wheezing or rales.  Abdominal:     General: Bowel sounds are normal. There is no distension.     Palpations: Abdomen is soft.     Tenderness: There is no abdominal tenderness.  Musculoskeletal:     Cervical back: Normal range of motion.  Skin:    General: Skin is warm and dry.  Neurological:     Mental Status: She is alert and oriented to person, place, and time.     Coordination: Coordination normal.     Vitals:   10/01/24 1345  BP: 102/72  Pulse: 85  Temp: 98.9 F (37.2 C)  TempSrc: Oral  SpO2: 98%  Weight: 151 lb 3.2 oz (68.6 kg)  Height: 5' 7 (1.702 m)    Assessment and Plan Assessment & Plan Prediabetes   A1c POC  done today and improved to 5.9% with dietary changes and weight loss. No medication needed. Continue current dietary modifications and weight management strategies.  Hyperlipidemia   Cholesterol levels increased after stopping medication, will need recheck since she has made dietary change she prefers to do this next visit. Continue dietary modifications focusing on reducing carbohydrates and sugars. Recheck cholesterol levels at next  visit.  Allergic rhinitis Uses flonases and singulair  which are refilled, she also uses promethazine /dm cough syrup prn for colds which is refilled today. Will need caution with refill given chronic tramadol  rx.   Chronic back pain without Sciatica   Chronic left-sided back pain. Previous injection was ineffective through her orthopedics. Administered depo-medrol  40 mg IM and toradol  30 mg IM today for pain management. Continue seeing pain management for her chronic tramadol  rx.   Hot flashes Increased symptoms will increase paroxetine  dose to 20 mg new rx done today. She will let us  know if effective.  Constipation   Rx linzess 72 mcg daily done today.    "

## 2024-10-01 NOTE — Patient Instructions (Signed)
 We will increase the paroxetine  to 20 mg daily to help with hot flashes.  Sugars are doing better today and the HgA1c is 5.9.

## 2024-10-01 NOTE — Telephone Encounter (Signed)
 Copied from CRM [id number]. Topic: General - Other >> Oct 01, 2024  2:30 PM Sally Kidd wrote: Reason for CRM: Pt stated that she just had an appt with Dr.Crawford today but forgot to get her shot for pain. I informed the pt (per cal) that Dr.Crawford and her nurse are currently with other pt's and would probably have to get another appt scheduled for the shot. Pt stated that she needs the shot and will be returning to the office shortly.

## 2024-10-04 NOTE — Telephone Encounter (Signed)
 Resolved

## 2024-11-08 ENCOUNTER — Telehealth: Payer: Self-pay

## 2024-11-08 NOTE — Telephone Encounter (Signed)
 Typically life insurance companies would have her fill out a form to request her records for us  and this would be processed by our records department.

## 2024-11-08 NOTE — Telephone Encounter (Signed)
 Copied from CRM #[id number]. Topic: Clinical - Medical Advice >> Nov 08, 2024  1:23 PM Gustabo D wrote: Pt needs a statement or document sent to her life insurance company about her wellness. Can leave vm if she doesn't answer
